# Patient Record
Sex: Female | Born: 1965
Health system: Southern US, Community
[De-identification: ages and names within clinical notes are randomized; demographics above are authoritative.]

## PROBLEM LIST (undated history)

## (undated) DIAGNOSIS — D649 Anemia, unspecified: Secondary | ICD-10-CM

## (undated) DIAGNOSIS — F329 Major depressive disorder, single episode, unspecified: Secondary | ICD-10-CM

## (undated) DIAGNOSIS — Z8489 Family history of other specified conditions: Secondary | ICD-10-CM

## (undated) DIAGNOSIS — M1990 Unspecified osteoarthritis, unspecified site: Secondary | ICD-10-CM

## (undated) DIAGNOSIS — K648 Other hemorrhoids: Secondary | ICD-10-CM

## (undated) DIAGNOSIS — F32A Depression, unspecified: Secondary | ICD-10-CM

## (undated) DIAGNOSIS — T7840XA Allergy, unspecified, initial encounter: Secondary | ICD-10-CM

## (undated) DIAGNOSIS — F419 Anxiety disorder, unspecified: Secondary | ICD-10-CM

## (undated) DIAGNOSIS — K649 Unspecified hemorrhoids: Secondary | ICD-10-CM

## (undated) DIAGNOSIS — G43909 Migraine, unspecified, not intractable, without status migrainosus: Secondary | ICD-10-CM

## (undated) DIAGNOSIS — K644 Residual hemorrhoidal skin tags: Secondary | ICD-10-CM

## (undated) DIAGNOSIS — M797 Fibromyalgia: Secondary | ICD-10-CM

## (undated) HISTORY — DX: Migraine, unspecified, not intractable, without status migrainosus: G43.909

## (undated) HISTORY — PX: OTHER SURGICAL HISTORY: SHX169

## (undated) HISTORY — PX: COLONOSCOPY: SHX174

## (undated) HISTORY — DX: Unspecified hemorrhoids: K64.9

## (undated) HISTORY — DX: Fibromyalgia: M79.7

## (undated) HISTORY — PX: TONSILLECTOMY: SUR1361

## (undated) HISTORY — DX: Depression, unspecified: F32.A

## (undated) HISTORY — DX: Allergy, unspecified, initial encounter: T78.40XA

## (undated) HISTORY — DX: Residual hemorrhoidal skin tags: K64.4

## (undated) HISTORY — DX: Major depressive disorder, single episode, unspecified: F32.9

## (undated) HISTORY — DX: Unspecified osteoarthritis, unspecified site: M19.90

## (undated) HISTORY — DX: Other hemorrhoids: K64.8

## (undated) HISTORY — DX: Anemia, unspecified: D64.9

## (undated) HISTORY — DX: Anxiety disorder, unspecified: F41.9

## (undated) HISTORY — PX: LAPAROSCOPIC ASSISTED VAGINAL HYSTERECTOMY: SHX5398

---

## 1999-11-16 ENCOUNTER — Other Ambulatory Visit: Admission: RE | Admit: 1999-11-16 | Discharge: 1999-11-16 | Payer: Self-pay | Admitting: Internal Medicine

## 2000-09-30 ENCOUNTER — Encounter: Payer: Self-pay | Admitting: Orthopedic Surgery

## 2000-09-30 ENCOUNTER — Ambulatory Visit (HOSPITAL_COMMUNITY): Admission: RE | Admit: 2000-09-30 | Discharge: 2000-09-30 | Payer: Self-pay | Admitting: Orthopedic Surgery

## 2001-04-26 ENCOUNTER — Other Ambulatory Visit: Admission: RE | Admit: 2001-04-26 | Discharge: 2001-04-26 | Payer: Self-pay | Admitting: Internal Medicine

## 2004-01-27 ENCOUNTER — Other Ambulatory Visit: Admission: RE | Admit: 2004-01-27 | Discharge: 2004-01-27 | Payer: Self-pay | Admitting: Obstetrics and Gynecology

## 2005-02-26 ENCOUNTER — Other Ambulatory Visit: Admission: RE | Admit: 2005-02-26 | Discharge: 2005-02-26 | Payer: Self-pay | Admitting: Obstetrics and Gynecology

## 2005-10-19 ENCOUNTER — Encounter: Admission: RE | Admit: 2005-10-19 | Discharge: 2005-10-19 | Payer: Self-pay | Admitting: Orthopaedic Surgery

## 2006-02-15 ENCOUNTER — Ambulatory Visit: Payer: Self-pay | Admitting: Internal Medicine

## 2006-05-11 ENCOUNTER — Ambulatory Visit: Payer: Self-pay | Admitting: Psychiatry

## 2006-05-11 ENCOUNTER — Other Ambulatory Visit (HOSPITAL_COMMUNITY): Admission: RE | Admit: 2006-05-11 | Discharge: 2006-08-09 | Payer: Self-pay | Admitting: Psychiatry

## 2006-08-03 ENCOUNTER — Ambulatory Visit: Payer: Self-pay | Admitting: Internal Medicine

## 2006-08-03 ENCOUNTER — Encounter: Admission: RE | Admit: 2006-08-03 | Discharge: 2006-08-03 | Payer: Self-pay | Admitting: Internal Medicine

## 2006-09-29 ENCOUNTER — Ambulatory Visit: Payer: Self-pay | Admitting: Internal Medicine

## 2006-11-03 ENCOUNTER — Encounter: Admission: RE | Admit: 2006-11-03 | Discharge: 2006-11-03 | Payer: Self-pay | Admitting: Obstetrics and Gynecology

## 2007-05-08 DIAGNOSIS — R519 Headache, unspecified: Secondary | ICD-10-CM | POA: Insufficient documentation

## 2007-05-08 DIAGNOSIS — J45909 Unspecified asthma, uncomplicated: Secondary | ICD-10-CM | POA: Insufficient documentation

## 2007-05-08 DIAGNOSIS — R51 Headache: Secondary | ICD-10-CM | POA: Insufficient documentation

## 2007-05-08 DIAGNOSIS — F329 Major depressive disorder, single episode, unspecified: Secondary | ICD-10-CM | POA: Insufficient documentation

## 2007-05-08 DIAGNOSIS — J309 Allergic rhinitis, unspecified: Secondary | ICD-10-CM | POA: Insufficient documentation

## 2007-11-14 ENCOUNTER — Encounter: Admission: RE | Admit: 2007-11-14 | Discharge: 2007-12-14 | Payer: Self-pay | Admitting: Internal Medicine

## 2007-12-29 ENCOUNTER — Encounter: Admission: RE | Admit: 2007-12-29 | Discharge: 2007-12-29 | Payer: Self-pay | Admitting: Obstetrics and Gynecology

## 2009-03-21 ENCOUNTER — Ambulatory Visit: Payer: Self-pay | Admitting: Gastroenterology

## 2009-03-21 DIAGNOSIS — K644 Residual hemorrhoidal skin tags: Secondary | ICD-10-CM | POA: Insufficient documentation

## 2009-03-21 DIAGNOSIS — K625 Hemorrhage of anus and rectum: Secondary | ICD-10-CM | POA: Insufficient documentation

## 2009-04-07 ENCOUNTER — Encounter: Payer: Self-pay | Admitting: Gastroenterology

## 2009-04-07 ENCOUNTER — Ambulatory Visit: Payer: Self-pay | Admitting: Gastroenterology

## 2009-04-16 ENCOUNTER — Encounter: Payer: Self-pay | Admitting: Gastroenterology

## 2010-12-23 ENCOUNTER — Telehealth: Payer: Self-pay | Admitting: Gastroenterology

## 2010-12-23 NOTE — Telephone Encounter (Signed)
i don't do hemorrhoidal removal, injection or banding.  I generally refer these patients to general surgery instead.

## 2010-12-23 NOTE — Telephone Encounter (Signed)
Pt with hx of hemorrhoids since childbirth; last OV 03/21/09 with Analpram ointment ordered with Sitz baths, increase fiber and COLON ordered. COLON on 04/07/2009 showing internal and external hemorrhoids and sessile polyp removal- hyperplastic only. Pt saw Dr Henderson Cloud who informed her she can have the hemorrhoids banded. Dr Christella Hartigan, can you do this and does she need an OV first? Thanks.

## 2010-12-23 NOTE — Telephone Encounter (Signed)
Informed pt Dr Christella Hartigan does not do banding and he referred her to general surgery. Pt requested Dr Arlyce Dice. Dr Christella Hartigan, is it ok for pt to see Dr Arlyce Dice about banding? Thanks.

## 2010-12-24 NOTE — Telephone Encounter (Signed)
Yes, that is a good idea. 

## 2010-12-24 NOTE — Telephone Encounter (Signed)
Dr. Arlyce Dice do you want to see her first?

## 2010-12-24 NOTE — Telephone Encounter (Signed)
Dr Arlyce Dice, could you see one of Dr Christella Hartigan' pts for hemorrhoid banding? Thanks.

## 2010-12-24 NOTE — Telephone Encounter (Signed)
yes

## 2010-12-25 ENCOUNTER — Encounter: Payer: Self-pay | Admitting: *Deleted

## 2010-12-25 NOTE — Telephone Encounter (Signed)
Yes, needs OV

## 2010-12-25 NOTE — Telephone Encounter (Signed)
Spoke with pt to inform her she needs an appointment with Dr Arlyce Dice to discuss her hemorrhoidal banding- 01/07/11 at 11:15. New pt info and letter mailed to pt.

## 2011-01-01 NOTE — Assessment & Plan Note (Signed)
[facility]                                 ON-CALL NOTE   NAME:Wade, Debbie TROYER                   MRN:          [id number]  DATE:08/20/2006                            DOB:          May 02, 1966    She calls from [phone] on August 20, 2006 complaining of pinkeye.  She  is a patient of Dr. Fabian Sharp.  Th clinic was closed by the time the nurse  was able to get in touch with her with regards that the patient go to  the Urgent Care if she could not wait until Monday to be seen.     Lelon Perla, DO  Electronically Signed    Shawnie Dapper  DD: 08/20/2006  DT: 08/20/2006  Job #: [id number]   cc:   Neta Mends. Fabian Sharp, MD

## 2011-01-07 ENCOUNTER — Ambulatory Visit: Payer: Self-pay | Admitting: Gastroenterology

## 2011-01-14 ENCOUNTER — Ambulatory Visit (INDEPENDENT_AMBULATORY_CARE_PROVIDER_SITE_OTHER): Payer: Managed Care, Other (non HMO) | Admitting: Gastroenterology

## 2011-01-14 ENCOUNTER — Encounter: Payer: Self-pay | Admitting: Gastroenterology

## 2011-01-14 DIAGNOSIS — K625 Hemorrhage of anus and rectum: Secondary | ICD-10-CM

## 2011-01-14 DIAGNOSIS — K644 Residual hemorrhoidal skin tags: Secondary | ICD-10-CM

## 2011-01-14 NOTE — Progress Notes (Signed)
History of Present Illness:  Debbie Wade is a pleasant 45 year old white female referred at the request of Dr. Christella Hartigan for consideration of band ligation for hemorrhoids. Hemorrhids have been  a persistent, intermittent problem for years. She develops  red blood per rectum consisting of blood on the toilet tissue. It is occasionally accompanied by rectal discomfort. She's tried various suppositories and topicals without significant relief.  2010 colonoscopy revealed internal and external hemorrhoids and a diminutive polyp which was hyperplastic.    Review of Systems: Pertinent positive and negative review of systems were noted in the above HPI section. All other review of systems were otherwise negative.    Current Medications, Allergies, Past Medical History, Past Surgical History, Family History and Social History were reviewed in [employer] electronic medical record  Vital signs were reviewed in today's medical record. Physical Exam: General: Well developed , well nourished, no acute distress Head: Normocephalic and atraumatic Eyes:  sclerae anicteric, EOMI Ears: Normal auditory acuity Mouth: No deformity or lesions Lungs: Clear throughout to auscultation Heart: Regular rate and rhythm; no murmurs, rubs or bruits Abdomen: Soft, non tender and non distended. No masses, hepatosplenomegaly or hernias noted. Normal Bowel sounds Rectal: There is an external skin tag and a single external hemorrhoid. Musculoskeletal: Symmetrical with no gross deformities  Pulses:  Normal pulses noted Extremities: No clubbing, cyanosis, edema or deformities noted Neurological: Alert oriented x 4, grossly nonfocal Psychological:  Alert and cooperative. Normal mood and affect

## 2011-01-14 NOTE — Assessment & Plan Note (Signed)
Limited rectal bleeding is due to hemorrhoids.  Recommendations #1 band ligation for internal hemorrhoids. I carefully explained to the patient this will not directly treat her external hemorrhoid nor her skin tag. Should she continue to have symptoms from these then surgical ligation will be required.

## 2011-01-14 NOTE — Patient Instructions (Signed)
Hemorrhoids Hemorrhoids are dilated (enlarged) veins around the rectum. Sometimes clots will form in the veins. This makes them swollen and painful. These are called thrombosed hemorrhoids. Causes of hemorrhoids include:  Pregnancy: this increases the pressure in the hemorrhoidal veins.   Constipation.   Straining to have a bowel movement.  HOME CARE INSTRUCTIONS  Eat a well balanced diet and drink 6 to 8 glasses of water every day to avoid constipation. You may also use a bulk laxative.   Avoid straining to have bowel movements.   Keep anal area dry and clean.   Only take over-the-counter or prescription medicines for pain, discomfort, or fever as directed by your caregiver.  If thrombosed:  Take hot sitz baths for 20 to 30 minutes, 3 to 4 times per day.   If the hemorrhoids are very tender and swollen, place ice packs on area as tolerated. Using ice packs between sitz baths may be helpful. Fill a plastic bag with ice and use a towel between the bag of ice and your skin.   Special creams and suppositories (Anusol, Nupercainal, Wyanoids) may be used or applied as directed.   Do not use a donut shaped pillow or sit on the toilet for long periods. This increases blood pooling and pain.   Move your bowels when your body has the urge; this will require less straining and will decrease pain and pressure.   Only take over-the-counter or prescription medicines for pain, discomfort, or fever as directed by your caregiver.  SEEK MEDICAL CARE IF:  You have increasing pain and swelling that is not controlled with your prescription.   You have uncontrolled bleeding.   You have an inability or difficulty having a bowel movement.   You have pain or inflammation outside the area of the hemorrhoids.  MAKE SURE YOU:   Understand these instructions.   Will watch your condition.   Will get help right away if you are not doing well or get worse.  Document Released: 07/30/2000 Document  Re-Released: 07/15/2008 [facility] Patient Information 2011 [place], [place].

## 2011-01-14 NOTE — Assessment & Plan Note (Signed)
See comments under rectal bleeding.

## 2011-01-27 ENCOUNTER — Ambulatory Visit (HOSPITAL_COMMUNITY)
Admission: RE | Admit: 2011-01-27 | Discharge: 2011-01-27 | Disposition: A | Discharge status: Home / Self Care | Payer: Managed Care, Other (non HMO) | Source: Ambulatory Visit | Attending: Gastroenterology | Admitting: Gastroenterology

## 2011-01-27 ENCOUNTER — Encounter: Payer: Managed Care, Other (non HMO) | Admitting: Gastroenterology

## 2011-01-27 DIAGNOSIS — F341 Dysthymic disorder: Secondary | ICD-10-CM | POA: Insufficient documentation

## 2011-01-27 DIAGNOSIS — K648 Other hemorrhoids: Secondary | ICD-10-CM | POA: Insufficient documentation

## 2011-02-02 ENCOUNTER — Encounter: Payer: Self-pay | Admitting: Gastroenterology

## 2011-02-26 ENCOUNTER — Ambulatory Visit (INDEPENDENT_AMBULATORY_CARE_PROVIDER_SITE_OTHER): Payer: Managed Care, Other (non HMO) | Admitting: Gastroenterology

## 2011-02-26 ENCOUNTER — Encounter: Payer: Self-pay | Admitting: Gastroenterology

## 2011-02-26 VITALS — BP 92/68 | HR 80 | Ht 63.0 in | Wt 116.2 lb

## 2011-02-26 DIAGNOSIS — K625 Hemorrhage of anus and rectum: Secondary | ICD-10-CM

## 2011-02-26 NOTE — Progress Notes (Signed)
History of Present Illness:  [name] returns following band ligation for hemorrhoids. She reports significant improvement in her symptoms. She is asymptomatic and is feeling quite well.    Review of Systems: Pertinent positive and negative review of systems were noted in the above HPI section. All other review of systems were otherwise negative.    Current Medications, Allergies, Past Medical History, Past Surgical History, Family History and Social History were reviewed in [employer] electronic medical record  Vital signs were reviewed in today's medical record. Physical Exam: General: Well developed , well nourished, no acute distress

## 2011-02-26 NOTE — Patient Instructions (Signed)
Follow up as needed

## 2011-02-26 NOTE — Assessment & Plan Note (Signed)
Secondary to internal hemorrhoids, status post band ligation with resolution of symptoms.

## 2012-02-07 ENCOUNTER — Other Ambulatory Visit: Payer: Self-pay | Admitting: Rheumatology

## 2012-02-07 DIAGNOSIS — M545 Low back pain, unspecified: Secondary | ICD-10-CM

## 2012-02-10 ENCOUNTER — Ambulatory Visit
Admission: RE | Admit: 2012-02-10 | Discharge: 2012-02-10 | Disposition: A | Discharge status: Home / Self Care | Payer: Managed Care, Other (non HMO) | Source: Ambulatory Visit | Attending: Rheumatology | Admitting: Rheumatology

## 2012-02-10 DIAGNOSIS — M545 Low back pain, unspecified: Secondary | ICD-10-CM

## 2012-02-10 MED ORDER — GADOBENATE DIMEGLUMINE 529 MG/ML IV SOLN
10.0000 mL | Freq: Once | INTRAVENOUS | Status: AC | PRN
  Administered 2012-02-10: 10 mL via INTRAVENOUS

## 2012-02-15 ENCOUNTER — Emergency Department (HOSPITAL_COMMUNITY)
Admission: EM | Admit: 2012-02-15 | Discharge: 2012-02-15 | Disposition: A | Discharge status: Home / Self Care | Payer: Managed Care, Other (non HMO) | Attending: Emergency Medicine | Admitting: Emergency Medicine

## 2012-02-15 ENCOUNTER — Encounter (HOSPITAL_COMMUNITY): Payer: Self-pay | Admitting: Emergency Medicine

## 2012-02-15 DIAGNOSIS — S61219A Laceration without foreign body of unspecified finger without damage to nail, initial encounter: Secondary | ICD-10-CM

## 2012-02-15 DIAGNOSIS — M1990 Unspecified osteoarthritis, unspecified site: Secondary | ICD-10-CM | POA: Insufficient documentation

## 2012-02-15 DIAGNOSIS — S61209A Unspecified open wound of unspecified finger without damage to nail, initial encounter: Secondary | ICD-10-CM | POA: Insufficient documentation

## 2012-02-15 DIAGNOSIS — W260XXA Contact with knife, initial encounter: Secondary | ICD-10-CM | POA: Insufficient documentation

## 2012-02-15 DIAGNOSIS — Y998 Other external cause status: Secondary | ICD-10-CM | POA: Insufficient documentation

## 2012-02-15 DIAGNOSIS — IMO0001 Reserved for inherently not codable concepts without codable children: Secondary | ICD-10-CM | POA: Insufficient documentation

## 2012-02-15 DIAGNOSIS — Y93G1 Activity, food preparation and clean up: Secondary | ICD-10-CM | POA: Insufficient documentation

## 2012-02-15 NOTE — ED Provider Notes (Signed)
Medical screening examination/treatment/procedure(s) were performed by non-physician practitioner and as supervising physician I was immediately available for consultation/collaboration.  Bridey Brookover, MD 02/15/12 1528 

## 2012-02-15 NOTE — ED Provider Notes (Signed)
History     CSN: [id number]  Arrival date & time 02/15/12  1414   First MD Initiated Contact with Patient 02/15/12 1501      No chief complaint on file.   (Consider location/radiation/quality/duration/timing/severity/associated sxs/prior treatment) HPI His resists emergency department a small laceration to her left index finger at the base.  This issues cumin avocado when the knife slipped stabbing her finger.  Patient states he did not have any numbness or weakness of the finger.  Patient states that she put direct pressure on my area before coming to the hospital.  Bleeding is controlled upon arrival. Past Medical History  Diagnosis Date  . Migraines   . Allergic rhinitis   . Depression   . Asthma   . Hemorrhoid   . Fibromyalgia   . Osteoarthritis   . Hemorrhoids     Past Surgical History  Procedure Date  . None     Family History  Problem Relation Age of Onset  . Diabetes Maternal Grandmother   . Hypertension Mother   . Diabetes Maternal Grandfather   . Heart disease Maternal Grandfather   . Heart failure Maternal Grandmother   . Colon cancer Neg Hx   . Breast cancer Paternal Aunt     History  Substance Use Topics  . Smoking status: Never Smoker   . Smokeless tobacco: Never Used  . Alcohol Use: Yes     rare    OB History    Grav Para Term Preterm Abortions TAB SAB Ect Mult Living                  Review of Systems All other systems negative except as documented in the HPI. All pertinent positives and negatives as reviewed in the HPI.  Allergies  Erythromycin  Home Medications   Current Outpatient Rx  Name Route Sig Dispense Refill  . ALMOTRIPTAN MALATE 12.5 MG PO TABS Oral Take 12.5 mg by mouth as needed. may repeat in 2 hours if needed    . ALPRAZOLAM 0.25 MG PO TABS Oral Take 0.25 mg by mouth as needed. Anxiety    . DOCUSATE SODIUM 50 MG PO CAPS Oral Take by mouth at bedtime.    Marland Kitchen HYDROCODONE-ACETAMINOPHEN 5-500 MG PO CAPS Oral Take 1 capsule  by mouth every 6 (six) hours as needed. pain    . IBUPROFEN 200 MG PO TABS Oral Take 600 mg by mouth every 6 (six) hours as needed. pain    . LEVETIRACETAM 500 MG PO TABS Oral Take 1,000-1,500 mg by mouth 2 (two) times daily.     . ADULT MULTIVITAMIN W/MINERALS CH Oral Take 1 tablet by mouth daily.    . SERTRALINE HCL 100 MG PO TABS Oral Take 100 mg by mouth daily.    . TRAMADOL HCL 50 MG PO TABS Oral Take 50 mg by mouth every 6 (six) hours as needed. pain      BP 114/69  Pulse 89  Temp 98 F (36.7 C)  Resp 16  SpO2 100%  LMP 01/28/2012  Physical Exam  Nursing note and vitals reviewed. Constitutional: She appears well-developed and well-nourished. No distress.  Musculoskeletal:       Left hand: She exhibits laceration. She exhibits normal range of motion, normal two-point discrimination and normal capillary refill. normal sensation noted. Normal strength noted. She exhibits no finger abduction and no thumb/finger opposition.       Hands:   ED Course  Procedures (including critical care time)  LACERATION REPAIR  Performed by: Carlyle Dolly Authorized by: Carlyle Dolly Consent: Verbal consent obtained. Risks and benefits: risks, benefits and alternatives were discussed Consent given by: patient Patient identity confirmed: provided demographic data Prepped and Draped in normal sterile fashion Wound explored  Laceration Location: medial proximal palmar aspect of the R index finger.  Laceration Length: 1.2 cm  No Foreign Bodies seen or palpated  Anesthesia: local infiltration  Local anesthetic: lidocaine2%   Anesthetic total: 4 ml  Irrigation method: syringe Amount of cleaning: standard  Skin closure: 5-0 prolene   Number of sutures: 3  Technique: Simple interrupted  Patient tolerance: Patient tolerated the procedure well with no immediate complications.    MDM          Carlyle Dolly, PA-C 02/15/12 1506

## 2012-02-15 NOTE — ED Notes (Signed)
Pt cut left index finger while cooking today. Small lac with controlled blooding noted. Pulses and sensation present to hand.

## 2012-05-23 ENCOUNTER — Other Ambulatory Visit: Payer: Self-pay | Admitting: Obstetrics and Gynecology

## 2012-11-09 ENCOUNTER — Other Ambulatory Visit (HOSPITAL_COMMUNITY): Payer: Self-pay | Admitting: *Deleted

## 2012-11-10 ENCOUNTER — Encounter (HOSPITAL_COMMUNITY): Payer: Managed Care, Other (non HMO)

## 2012-11-23 ENCOUNTER — Ambulatory Visit (HOSPITAL_COMMUNITY)
Admission: RE | Admit: 2012-11-23 | Discharge: 2012-11-23 | Disposition: A | Discharge status: Home / Self Care | Payer: Managed Care, Other (non HMO) | Source: Ambulatory Visit | Attending: Endocrinology | Admitting: Endocrinology

## 2012-11-23 DIAGNOSIS — E2749 Other adrenocortical insufficiency: Secondary | ICD-10-CM | POA: Insufficient documentation

## 2012-11-23 MED ORDER — COSYNTROPIN 0.25 MG IJ SOLR
0.2500 mg | Freq: Once | INTRAMUSCULAR | Status: AC
  Administered 2012-11-23: 0.25 mg via INTRAVENOUS
  Filled 2012-11-23: qty 0.25

## 2012-11-23 MED ORDER — SODIUM CHLORIDE 0.9 % IV SOLN
Freq: Once | INTRAVENOUS | Status: AC
  Administered 2012-11-23: 09:00:00 via INTRAVENOUS

## 2012-11-24 LAB — ACTH STIMULATION, 3 TIME POINTS
Cortisol, 30 Min: 17.2 ug/dL — ABNORMAL LOW (ref >20)
Cortisol, Base: 9.4 ug/dL

## 2012-11-24 LAB — ACTH: C206 ACTH: 18 pg/mL (ref 10–46)

## 2014-11-05 ENCOUNTER — Other Ambulatory Visit: Payer: Self-pay | Admitting: Obstetrics and Gynecology

## 2014-11-07 ENCOUNTER — Other Ambulatory Visit: Payer: Self-pay | Admitting: Obstetrics and Gynecology

## 2014-11-07 DIAGNOSIS — R928 Other abnormal and inconclusive findings on diagnostic imaging of breast: Secondary | ICD-10-CM

## 2014-11-07 LAB — CYTOLOGY - PAP

## 2014-11-14 ENCOUNTER — Ambulatory Visit
Admission: RE | Admit: 2014-11-14 | Discharge: 2014-11-14 | Disposition: A | Discharge status: Home / Self Care | Payer: Managed Care, Other (non HMO) | Source: Ambulatory Visit | Attending: Obstetrics and Gynecology | Admitting: Obstetrics and Gynecology

## 2014-11-14 DIAGNOSIS — R928 Other abnormal and inconclusive findings on diagnostic imaging of breast: Secondary | ICD-10-CM

## 2015-03-27 ENCOUNTER — Encounter: Payer: Self-pay | Admitting: Gastroenterology

## 2015-12-23 ENCOUNTER — Other Ambulatory Visit: Payer: Self-pay | Admitting: Orthopaedic Surgery

## 2015-12-23 DIAGNOSIS — M47819 Spondylosis without myelopathy or radiculopathy, site unspecified: Secondary | ICD-10-CM

## 2015-12-30 ENCOUNTER — Ambulatory Visit
Admission: RE | Admit: 2015-12-30 | Discharge: 2015-12-30 | Disposition: A | Discharge status: Home / Self Care | Payer: Managed Care, Other (non HMO) | Source: Ambulatory Visit | Attending: Orthopaedic Surgery | Admitting: Orthopaedic Surgery

## 2015-12-30 DIAGNOSIS — M47819 Spondylosis without myelopathy or radiculopathy, site unspecified: Secondary | ICD-10-CM

## 2019-01-09 DIAGNOSIS — F419 Anxiety disorder, unspecified: Secondary | ICD-10-CM | POA: Diagnosis not present

## 2019-01-09 DIAGNOSIS — F3340 Major depressive disorder, recurrent, in remission, unspecified: Secondary | ICD-10-CM | POA: Diagnosis not present

## 2019-02-06 DIAGNOSIS — F3340 Major depressive disorder, recurrent, in remission, unspecified: Secondary | ICD-10-CM | POA: Diagnosis not present

## 2019-02-06 DIAGNOSIS — F419 Anxiety disorder, unspecified: Secondary | ICD-10-CM | POA: Diagnosis not present

## 2019-02-11 DIAGNOSIS — L259 Unspecified contact dermatitis, unspecified cause: Secondary | ICD-10-CM | POA: Diagnosis not present

## 2019-02-13 DIAGNOSIS — H0102A Squamous blepharitis right eye, upper and lower eyelids: Secondary | ICD-10-CM | POA: Diagnosis not present

## 2019-02-13 DIAGNOSIS — H04123 Dry eye syndrome of bilateral lacrimal glands: Secondary | ICD-10-CM | POA: Diagnosis not present

## 2019-02-13 DIAGNOSIS — H25013 Cortical age-related cataract, bilateral: Secondary | ICD-10-CM | POA: Diagnosis not present

## 2019-02-13 DIAGNOSIS — H43391 Other vitreous opacities, right eye: Secondary | ICD-10-CM | POA: Diagnosis not present

## 2019-03-06 DIAGNOSIS — F419 Anxiety disorder, unspecified: Secondary | ICD-10-CM | POA: Diagnosis not present

## 2019-03-06 DIAGNOSIS — F3340 Major depressive disorder, recurrent, in remission, unspecified: Secondary | ICD-10-CM | POA: Diagnosis not present

## 2019-03-16 ENCOUNTER — Encounter: Payer: Self-pay | Admitting: Gastroenterology

## 2019-04-03 DIAGNOSIS — F3340 Major depressive disorder, recurrent, in remission, unspecified: Secondary | ICD-10-CM | POA: Diagnosis not present

## 2019-04-03 DIAGNOSIS — F419 Anxiety disorder, unspecified: Secondary | ICD-10-CM | POA: Diagnosis not present

## 2019-04-18 DIAGNOSIS — F4322 Adjustment disorder with anxiety: Secondary | ICD-10-CM | POA: Diagnosis not present

## 2019-04-24 DIAGNOSIS — F4322 Adjustment disorder with anxiety: Secondary | ICD-10-CM | POA: Diagnosis not present

## 2019-04-25 DIAGNOSIS — F4322 Adjustment disorder with anxiety: Secondary | ICD-10-CM | POA: Diagnosis not present

## 2019-04-30 ENCOUNTER — Encounter: Payer: Self-pay | Admitting: Gastroenterology

## 2019-05-01 DIAGNOSIS — F4322 Adjustment disorder with anxiety: Secondary | ICD-10-CM | POA: Diagnosis not present

## 2019-05-01 DIAGNOSIS — F3340 Major depressive disorder, recurrent, in remission, unspecified: Secondary | ICD-10-CM | POA: Diagnosis not present

## 2019-05-01 DIAGNOSIS — F419 Anxiety disorder, unspecified: Secondary | ICD-10-CM | POA: Diagnosis not present

## 2019-05-04 DIAGNOSIS — F4322 Adjustment disorder with anxiety: Secondary | ICD-10-CM | POA: Diagnosis not present

## 2019-05-15 DIAGNOSIS — F3340 Major depressive disorder, recurrent, in remission, unspecified: Secondary | ICD-10-CM | POA: Diagnosis not present

## 2019-05-15 DIAGNOSIS — F419 Anxiety disorder, unspecified: Secondary | ICD-10-CM | POA: Diagnosis not present

## 2019-05-24 DIAGNOSIS — F419 Anxiety disorder, unspecified: Secondary | ICD-10-CM | POA: Diagnosis not present

## 2019-05-29 DIAGNOSIS — G43719 Chronic migraine without aura, intractable, without status migrainosus: Secondary | ICD-10-CM | POA: Diagnosis not present

## 2019-05-29 DIAGNOSIS — G43109 Migraine with aura, not intractable, without status migrainosus: Secondary | ICD-10-CM | POA: Diagnosis not present

## 2019-06-01 ENCOUNTER — Other Ambulatory Visit: Payer: Self-pay

## 2019-06-01 ENCOUNTER — Ambulatory Visit (AMBULATORY_SURGERY_CENTER): Payer: BC Managed Care – PPO | Admitting: *Deleted

## 2019-06-01 VITALS — Temp 96.2°F | Ht 63.0 in | Wt 124.2 lb

## 2019-06-01 DIAGNOSIS — Z1159 Encounter for screening for other viral diseases: Secondary | ICD-10-CM

## 2019-06-01 DIAGNOSIS — Z1211 Encounter for screening for malignant neoplasm of colon: Secondary | ICD-10-CM

## 2019-06-01 MED ORDER — PEG 3350-KCL-NA BICARB-NACL 420 G PO SOLR: 4000.0000 mL | Freq: Once | ORAL | 0 refills | Status: AC

## 2019-06-01 NOTE — Progress Notes (Signed)
No egg or soy allergy known to patient  No issues with past sedation with any surgeries  or procedures, no intubation problems  No diet pills per patient No home 02 use per patient  No blood thinners per patient  Pt denies issues with constipation  No A fib or A flutter  EMMI video sent to pt's e mail   Due to the COVID-19 pandemic we are asking patients to follow these guidelines. Please only bring one care partner. Please be aware that your care partner may wait in the car in the parking lot or if they feel like they will be too hot to wait in the car, they may wait in the lobby on the 4th floor. All care partners are required to wear a mask the entire time (we do not have any that we can provide them), they need to practice social distancing, and we will do a Covid check for all patient's and care partners when you arrive. Also we will check their temperature and your temperature. If the care partner waits in their car they need to stay in the parking lot the entire time and we will call them on their cell phone when the patient is ready for discharge so they can bring the car to the front of the building. Also all patient's will need to wear a mask into building.  covid screening 06/12/19, 8:15 am

## 2019-06-05 ENCOUNTER — Encounter: Payer: Self-pay | Admitting: Gastroenterology

## 2019-06-08 ENCOUNTER — Telehealth: Payer: Self-pay | Admitting: Gastroenterology

## 2019-06-08 NOTE — Telephone Encounter (Signed)
Pt states has a huge skin tag that cannot be removed  and having a nasty flare of hemorrhoids-  She wanted to make Dr Ardis Hughs aware- I encouraged her to tell DR Ardis Hughs this as well the day of her colon- also fo rher to use Prep H wipes or desitin or vasline while she preps  Debbie Wade PV

## 2019-06-08 NOTE — Telephone Encounter (Signed)
Pls call pt, she states that she forgot to mention some information during her pre-visit.

## 2019-06-08 NOTE — Telephone Encounter (Signed)
Golytely on back order- Suprep 60$ with coupon-  Pt states too expensive- Sample Suprep with new instructions Lot [id number] exp 7/22- pt to pick up in [facility] room 51 Tuesday 10/27 before her covid test - I told pt 50 but nancy is aware if this note so she put in room 51 as pt will pick up before 8 am - she and I went over how to do [place] over the phone and foods to hold prior to colon-

## 2019-06-08 NOTE — Telephone Encounter (Signed)
Pt is scheduled for a 06/15/19 colon and reported that [facility] stated that all prep solutions are on backorder.

## 2019-06-12 ENCOUNTER — Other Ambulatory Visit: Payer: Self-pay

## 2019-06-12 DIAGNOSIS — F419 Anxiety disorder, unspecified: Secondary | ICD-10-CM | POA: Diagnosis not present

## 2019-06-12 DIAGNOSIS — Z1159 Encounter for screening for other viral diseases: Secondary | ICD-10-CM

## 2019-06-12 NOTE — Progress Notes (Signed)
c 

## 2019-06-13 LAB — SARS CORONAVIRUS 2 (TAT 6-24 HRS): SARS Coronavirus 2: NEGATIVE

## 2019-06-15 ENCOUNTER — Encounter: Payer: Self-pay | Admitting: Gastroenterology

## 2019-06-15 ENCOUNTER — Other Ambulatory Visit: Payer: Self-pay

## 2019-06-15 ENCOUNTER — Ambulatory Visit (AMBULATORY_SURGERY_CENTER): Payer: BC Managed Care – PPO | Admitting: Gastroenterology

## 2019-06-15 VITALS — BP 110/70 | HR 65 | Temp 97.8°F | Resp 13 | Ht 63.0 in | Wt 124.0 lb

## 2019-06-15 DIAGNOSIS — Z1211 Encounter for screening for malignant neoplasm of colon: Secondary | ICD-10-CM | POA: Diagnosis not present

## 2019-06-15 DIAGNOSIS — K649 Unspecified hemorrhoids: Secondary | ICD-10-CM

## 2019-06-15 MED ORDER — SODIUM CHLORIDE 0.9 % IV SOLN: 500.0000 mL | Freq: Once | INTRAVENOUS | Status: DC

## 2019-06-15 NOTE — Progress Notes (Signed)
Patient considering hemorrhoid banding procedure in the office. I called Patty in the GI office to alert her to call patient to schedule this once patient gets home and checks her schedule.

## 2019-06-15 NOTE — Progress Notes (Signed)
Report to PACU, RN, vss, BBS= Clear.  

## 2019-06-15 NOTE — Op Note (Signed)
[facility] Patient Name: Debbie Wade Procedure Date: 06/15/2019 8:34 AM MRN: [id number] Endoscopist: Rachael Fee , MD Age: 53 Referring MD:  Date of Birth: 09-26-1965 Gender: Female Account #: [id number] Procedure:                Colonoscopy Indications:              Screening for colorectal malignant neoplasm Medicines:                Monitored Anesthesia Care Procedure:                Pre-Anesthesia Assessment:                           - Prior to the procedure, a History and Physical                            was performed, and patient medications and                            allergies were reviewed. The patient's tolerance of                            previous anesthesia was also reviewed. The risks                            and benefits of the procedure and the sedation                            options and risks were discussed with the patient.                            All questions were answered, and informed consent                            was obtained. Prior Anticoagulants: The patient has                            taken no previous anticoagulant or antiplatelet                            agents. ASA Grade Assessment: II - A patient with                            mild systemic disease. After reviewing the risks                            and benefits, the patient was deemed in                            satisfactory condition to undergo the procedure.                           After obtaining informed consent, the colonoscope  was passed under direct vision. Throughout the                            procedure, the patient's blood pressure, pulse, and                            oxygen saturations were monitored continuously. The                            Colonoscope was introduced through the anus and                            advanced to the the cecum, identified by                            appendiceal orifice  and ileocecal valve. The                            colonoscopy was performed without difficulty. The                            patient tolerated the procedure well. The quality                            of the bowel preparation was good. The ileocecal                            valve, appendiceal orifice, and rectum were                            photographed. Scope In: 8:51:59 AM Scope Out: 9:08:41 AM Scope Withdrawal Time: 0 hours 7 minutes 1 second  Total Procedure Duration: 0 hours 16 minutes 42 seconds  Findings:                 External and internal hemorrhoids were found. The                            hemorrhoids were medium-sized.                           The exam was otherwise without abnormality on                            direct and retroflexion views. Complications:            No immediate complications. Estimated blood loss:                            None. Estimated Blood Loss:     Estimated blood loss: none. Impression:               - External and internal hemorrhoids.                           - The examination was otherwise normal on direct  and retroflexion views.                           - No polyps or cancers. Recommendation:           - Patient has a contact number available for                            emergencies. The signs and symptoms of potential                            delayed complications were discussed with the                            patient. Return to normal activities tomorrow.                            Written discharge instructions were provided to the                            patient.                           - Resume previous diet.                           - Continue present medications.                           - Repeat colonoscopy in 10 years for screening.                           - Consider referral for in-office hemorrhoidal                            banding. Rachael Feeaniel P Denim Start, MD 06/15/2019  9:11:37 AM This report has been signed electronically.

## 2019-06-15 NOTE — Patient Instructions (Addendum)
YOU HAD AN ENDOSCOPIC PROCEDURE TODAY AT [facility]:   Refer to the procedure report that was given to you for any specific questions about what was found during the examination.  If the procedure report does not answer your questions, please call your gastroenterologist to clarify.  If you requested that your care partner not be given the details of your procedure findings, then the procedure report has been included in a sealed envelope for you to review at your convenience later.  YOU SHOULD EXPECT: Some feelings of bloating in the abdomen. Passage of more gas than usual.  Walking can help get rid of the air that was put into your GI tract during the procedure and reduce the bloating. If you had a lower endoscopy (such as a colonoscopy or flexible sigmoidoscopy) you may notice spotting of blood in your stool or on the toilet paper. If you underwent a bowel prep for your procedure, you may not have a normal bowel movement for a few days.  Please Note:  You might notice some irritation and congestion in your nose or some drainage.  This is from the oxygen used during your procedure.  There is no need for concern and it should clear up in a day or so.  SYMPTOMS TO REPORT IMMEDIATELY:   Following lower endoscopy (colonoscopy or flexible sigmoidoscopy):  Excessive amounts of blood in the stool  Significant tenderness or worsening of abdominal pains  Swelling of the abdomen that is new, acute  Fever of 100F or higher   For urgent or emergent issues, a gastroenterologist can be reached at any hour by calling [phone].   DIET:  We do recommend a small meal at first, but then you may proceed to your regular diet.  Drink plenty of fluids but you should avoid alcoholic beverages for 24 hours.  MEDICATIONS: Continue present medications.  Follow up: Consider returning to the office for hemorrhoid banding (see handout).  Please see handouts given to you by your recovery  nurse.  ACTIVITY:  You should plan to take it easy for the rest of today and you should NOT DRIVE or use heavy machinery until tomorrow (because of the sedation medicines used during the test).    FOLLOW UP: Our staff will call the number listed on your records 48-72 hours following your procedure to check on you and address any questions or concerns that you may have regarding the information given to you following your procedure. If we do not reach you, we will leave a message.  We will attempt to reach you two times.  During this call, we will ask if you have developed any symptoms of COVID 19. If you develop any symptoms (ie: fever, flu-like symptoms, shortness of breath, cough etc.) before then, please call [phone].  If you test positive for Covid 19 in the 2 weeks post procedure, please call and report this information to [place].    If any biopsies were taken you will be contacted by phone or by letter within the next 1-3 weeks.  Please call us at [phone] if you have not heard about the biopsies in 3 weeks.   Thank you for allowing [place] to provide for your healthcare needs today.   SIGNATURES/CONFIDENTIALITY: You and/or your care partner have signed paperwork which will be entered into your electronic medical record.  These signatures attest to the fact that that the information above on your After Visit Summary has been reviewed and is understood.  Full responsibility of the confidentiality of this discharge information lies with you and/or your care-partner.

## 2019-06-15 NOTE — Progress Notes (Signed)
Pt's states no medical or surgical changes since previsit or office visit. VS by CW. Temp by LC 

## 2019-06-19 ENCOUNTER — Telehealth: Payer: Self-pay

## 2019-06-19 ENCOUNTER — Telehealth: Payer: Self-pay | Admitting: *Deleted

## 2019-06-19 NOTE — Telephone Encounter (Signed)
  Follow up Call-  Call back number 06/15/2019  Post procedure Call Back phone  # [phone]  Permission to leave phone message Yes  Some recent data might be hidden     Patient questions:  Do you have a fever, pain , or abdominal swelling? No. Pain Score  0 *  Have you tolerated food without any problems? Yes.    Have you been able to return to your normal activities? Yes.    Do you have any questions about your discharge instructions: Diet   No. Medications  No. Follow up visit  No.  Do you have questions or concerns about your Care? No.  Actions: * If pain score is 4 or above: No action needed, pain <4.  1. Have you developed a fever since your procedure? no  2.   Have you had an respiratory symptoms (SOB or cough) since your procedure? no  3.   Have you tested positive for COVID 19 since your procedureno  4.   Have you had any family members/close contacts diagnosed with the COVID 19 since your procedure?  no   If yes to any of these questions please route to Joylene John, RN and Alphonsa Gin, Therapist, sports.

## 2019-06-19 NOTE — Telephone Encounter (Signed)
Left a detailed message for her to call us back if she wants to proceed with the hemorrhoid banding.

## 2019-06-19 NOTE — Telephone Encounter (Signed)
Attempted to reach pt. With follow-up call following endoscopic procedure 06/15/2019.  LM on pt. Voice mail.  Will try to reach pt. Again later today. 

## 2019-06-19 NOTE — Telephone Encounter (Signed)
-----   Message from Tanay Massiah E [place], [place] sent at 06/15/2019 10:31 AM EDT -----  Dr Ardis Hughs patient needs to be set up for a banding with someone. Call and set it up. She had a [facility] procedure on Friday.

## 2019-06-20 NOTE — Telephone Encounter (Signed)
Pt called back and has an appointment on 06/22/2019 with Dr Silverio Decamp.

## 2019-06-22 ENCOUNTER — Encounter: Payer: BC Managed Care – PPO | Admitting: Gastroenterology

## 2019-06-29 DIAGNOSIS — F4322 Adjustment disorder with anxiety: Secondary | ICD-10-CM | POA: Diagnosis not present

## 2019-07-04 DIAGNOSIS — F419 Anxiety disorder, unspecified: Secondary | ICD-10-CM | POA: Diagnosis not present

## 2019-07-06 DIAGNOSIS — F4322 Adjustment disorder with anxiety: Secondary | ICD-10-CM | POA: Diagnosis not present

## 2019-07-18 ENCOUNTER — Ambulatory Visit: Payer: BC Managed Care – PPO | Admitting: Gastroenterology

## 2019-07-18 ENCOUNTER — Encounter: Payer: Self-pay | Admitting: Gastroenterology

## 2019-07-18 VITALS — BP 92/70 | HR 72 | Temp 97.8°F | Ht 63.0 in | Wt 125.4 lb

## 2019-07-18 DIAGNOSIS — K641 Second degree hemorrhoids: Secondary | ICD-10-CM | POA: Diagnosis not present

## 2019-07-18 DIAGNOSIS — F4322 Adjustment disorder with anxiety: Secondary | ICD-10-CM | POA: Diagnosis not present

## 2019-07-18 NOTE — Patient Instructions (Signed)

## 2019-07-18 NOTE — Progress Notes (Signed)
PROCEDURE NOTE: The patient presents with symptomatic grade II hemorrhoids, requesting rubber band ligation of his/her hemorrhoidal disease.  All risks, benefits and alternative forms of therapy were described and informed consent was obtained.  In the Left Lateral Decubitus position anoscopic examination revealed grade II hemorrhoids in the Right anterior, right posterior and left lateral position(s).  The anorectum was pre-medicated with 0.125% nitroglycerin and RectiCare The decision was made to band the right anterior internal hemorrhoid, and the [place] was used to perform band ligation without complication.  Digital anorectal examination was then performed to assure proper positioning of the band, and to adjust the banded tissue as required.  The patient was discharged home without pain or other issues.  Dietary and behavioral recommendations were given and along with follow-up instructions.     The following adjunctive treatments were recommended: Benefiber 1 tablespoon 3 times daily with meals  The patient will return in 2 to 4 weeks for  follow-up and possible additional banding as required. No complications were encountered and the patient tolerated the procedure well.  Damaris Hippo , MD [phone]

## 2019-07-20 DIAGNOSIS — F4322 Adjustment disorder with anxiety: Secondary | ICD-10-CM | POA: Diagnosis not present

## 2019-07-24 DIAGNOSIS — F4322 Adjustment disorder with anxiety: Secondary | ICD-10-CM | POA: Diagnosis not present

## 2019-07-25 DIAGNOSIS — F4322 Adjustment disorder with anxiety: Secondary | ICD-10-CM | POA: Diagnosis not present

## 2019-07-31 DIAGNOSIS — F4322 Adjustment disorder with anxiety: Secondary | ICD-10-CM | POA: Diagnosis not present

## 2019-08-02 DIAGNOSIS — F419 Anxiety disorder, unspecified: Secondary | ICD-10-CM | POA: Diagnosis not present

## 2019-08-03 DIAGNOSIS — F4322 Adjustment disorder with anxiety: Secondary | ICD-10-CM | POA: Diagnosis not present

## 2019-08-06 DIAGNOSIS — F4322 Adjustment disorder with anxiety: Secondary | ICD-10-CM | POA: Diagnosis not present

## 2019-09-03 ENCOUNTER — Encounter: Payer: Self-pay | Admitting: *Deleted

## 2019-09-03 DIAGNOSIS — F419 Anxiety disorder, unspecified: Secondary | ICD-10-CM | POA: Diagnosis not present

## 2019-09-05 ENCOUNTER — Ambulatory Visit (INDEPENDENT_AMBULATORY_CARE_PROVIDER_SITE_OTHER): Payer: BLUE CROSS/BLUE SHIELD | Admitting: Gastroenterology

## 2019-09-05 ENCOUNTER — Encounter: Payer: Self-pay | Admitting: Gastroenterology

## 2019-09-05 VITALS — BP 112/60 | HR 61 | Temp 98.6°F | Ht 63.0 in | Wt 128.0 lb

## 2019-09-05 DIAGNOSIS — K641 Second degree hemorrhoids: Secondary | ICD-10-CM

## 2019-09-05 DIAGNOSIS — R140 Abdominal distension (gaseous): Secondary | ICD-10-CM

## 2019-09-05 NOTE — Progress Notes (Signed)
PROCEDURE NOTE: The patient presents with symptomatic grade II  hemorrhoids, requesting rubber band ligation of his/her hemorrhoidal disease.  All risks, benefits and alternative forms of therapy were described and informed consent was obtained.   The anorectum was pre-medicated with 0.125% NTG and Recticare The decision was made to band the Right posterior internal hemorrhoid, and the [facility] O'Regan System was used to perform band ligation without complication.  Digital anorectal examination was then performed to assure proper positioning of the band, and to adjust the banded tissue as required.  The patient was discharged home without pain or other issues.  Dietary and behavioral recommendations were given and along with follow-up instructions.     The following adjunctive treatments were recommended:  Benefiber 1 teaspoon twice daily Trial of lactose-free diet  The patient will return in 2 to 4 weeks for  follow-up and possible additional banding as required. No complications were encountered and the patient tolerated the procedure well.  Iona Beard , MD [phone]

## 2019-09-05 NOTE — Patient Instructions (Addendum)
HEMORRHOID BANDING PROCEDURE    FOLLOW-UP CARE   1. The procedure you have had should have been relatively painless since the banding of the area involved does not have nerve endings and there is no pain sensation.  The rubber band cuts off the blood supply to the hemorrhoid and the band may fall off as soon as 48 hours after the banding (the band may occasionally be seen in the toilet bowl following a bowel movement). You may notice a temporary feeling of fullness in the rectum which should respond adequately to plain Tylenol or Motrin.  2. Following the banding, avoid strenuous exercise that evening and resume full activity the next day.  A sitz bath (soaking in a warm tub) or bidet is soothing, and can be useful for cleansing the area after bowel movements.     3. To avoid constipation, take two tablespoons of natural wheat bran, natural oat bran, flax, Benefiber or any over the counter fiber supplement and increase your water intake to 7-8 glasses daily.    4. Unless you have been prescribed anorectal medication, do not put anything inside your rectum for two weeks: No suppositories, enemas, fingers, etc.  5. Occasionally, you may have more bleeding than usual after the banding procedure.  This is often from the untreated hemorrhoids rather than the treated one.  Don't be concerned if there is a tablespoon or so of blood.  If there is more blood than this, lie flat with your bottom higher than your head and apply an ice pack to the area. If the bleeding does not stop within a half an hour or if you feel faint, call our office at [phone] or go to the emergency room.  6. Problems are not common; however, if there is a substantial amount of bleeding, severe pain, chills, fever or difficulty passing urine (very rare) or other problems, you should call us at [phone] or report to the nearest emergency room.  7. Do not stay seated continuously for more than 2-3 hours for a day or two  after the procedure.  Tighten your buttock muscles 10-15 times every two hours and take 10-15 deep breaths every 1-2 hours.  Do not spend more than a few minutes on the toilet if you cannot empty your bowel; instead re-visit the toilet at a later time.    Continue Fiber twice daily   Lactose Intolerance, Adult Lactose is a natural sugar that is found in dairy milk and dairy products such as cheese and yogurt. Lactose is digested by lactase, a protein (enzyme) in your small intestine. Some people do not produce enough lactase to digest lactose. This is called lactose intolerance. Lactose intolerance is different from milk allergy, which is a more serious reaction to the protein in milk. What are the causes? Causes of lactose intolerance may include:  Normal aging. The ability to produce lactase may lessen with age, causing lactose intolerance over time.  Being born without the ability to make lactase.  Digestive diseases such as gastroenteritis or inflammatory bowel disease (IBD).  Surgery or injury to your small intestine.  Infection in your intestines.  Certain antibiotic medicines and cancer treatments. What are the signs or symptoms? Lactose intolerance can cause discomfort within 30 minutes to 2 hours after you eat or drink something that contains lactose. Symptoms may include:  Nausea.  Diarrhea.  Cramps or pain in the abdomen.  A full, tight, or painful feeling in the abdomen (bloating).  Gas. How  is this diagnosed? This condition may be diagnosed based on:  Your symptoms and medical history.  Lactose tolerance test. This test involves drinking a lactose solution and then having blood tests to measure the amount of glucose in your blood. If your blood glucose level does not go up, it means your body is not able to digest the lactose.  Lactose breath test (hydrogen breath test). This test involves drinking a lactose solution and then exhaling into a type of bag while  you digest the solution. Having a lot of hydrogen in your breath can be a sign of lactose intolerance.  Stool acidity test. This involves drinking a lactose solution and then having your stool samples tested for bacteria. Having a lot of bacteria causes stool to be considered acidic, which is a sign of lactose intolerance. How is this treated? There is no treatment to improve your body's ability to produce lactase. However, you can manage your symptoms at home by:  Limiting or avoiding dairy milk, dairy products, and other sources of lactose.  Taking lactase tablets when you eat milk products. Lactase tablets are over-the-counter medicines that help to improve lactose digestion. You may also add lactase drops to regular milk.  Adjusting your diet, such as drinking lactose-free milk. Lactose tolerance varies from person to person. Some people may be able to eat or drink small amounts of products that contain lactose, and other people may need to avoid all foods and drinks that contain lactose. Talk with your health care provider about what treatment is best for you. Follow these instructions at home:  Limit or avoid foods, beverages, and medicines that contain lactose, as told by your health care provider. Keep track of which foods, beverages, or medicines cause symptoms so you can decide what to avoid in the future.  Read food and medicine labels carefully. Avoid products that contain: ? Lactose. ? Milk solids. ? Casein. ? Whey.  Take over-the-counter and prescription medicines (including lactase tablets) only as told by your health care provider.  If you stop eating and drinking dairy products (eliminate dairy from your diet), make sure to get enough protein, calcium, and vitamin D from other foods. Work with your health care provider or a diet and nutrition specialist (dietitian) to make sure you get enough of those nutrients.  Choose a milk substitute that is fortified with calcium and  vitamin D. ? Soy milk contains high-quality protein. ? Milks that are made from nuts or grains (such as almond milk and rice milk) contain very small amounts of protein.  Keep all follow-up visits as told by your health care provider. This is important. Contact a health care provider if:  You have no relief from your symptoms after you have eliminated milk products and other sources of lactose. Get help right away if:  You have blood in your stool.  You have severe abdomen (abdominal) pain. Summary  Lactose is a natural sugar that is found in dairy milk and dairy products such as cheese and yogurt. Lactose is digested by lactase, which is a protein (enzyme) in the small intestine.  Some people do not produce enough lactase to digest lactose. This is called lactose intolerance.  Lactose intolerance can cause discomfort within 30 minutes to 2 hours after you eat or drink something that contains lactose.  Limit or avoid foods, beverages, and medicines that contain lactose, as told by your health care provider. This information is not intended to replace advice given to you by your  health care provider. Make sure you discuss any questions you have with your health care provider. Document Revised: 07/15/2017 Document Reviewed: 03/18/2017 Elsevier Patient Education  [place].  I appreciate the  opportunity to care for you  Thank You   Harl Bowie , MD

## 2019-09-24 ENCOUNTER — Ambulatory Visit: Payer: BLUE CROSS/BLUE SHIELD | Admitting: Gastroenterology

## 2019-09-24 ENCOUNTER — Other Ambulatory Visit: Payer: Self-pay

## 2019-09-24 ENCOUNTER — Encounter: Payer: Self-pay | Admitting: Gastroenterology

## 2019-09-24 VITALS — BP 118/62 | HR 80 | Temp 97.8°F | Ht 63.0 in | Wt 126.0 lb

## 2019-09-24 DIAGNOSIS — K641 Second degree hemorrhoids: Secondary | ICD-10-CM | POA: Diagnosis not present

## 2019-09-24 NOTE — Progress Notes (Signed)
PROCEDURE NOTE: The patient presents with symptomatic grade II  hemorrhoids, requesting rubber band ligation of his/her hemorrhoidal disease.  All risks, benefits and alternative forms of therapy were described and informed consent was obtained.   The anorectum was pre-medicated with 0.125% NTG and Recticare The decision was made to band the Left lateral internal hemorrhoid, and the [facility] O'Regan System was used to perform band ligation without complication.  Digital anorectal examination was then performed to assure proper positioning of the band, and to adjust the banded tissue as required.  The patient was discharged home without pain or other issues.  Dietary and behavioral recommendations were given and along with follow-up instructions.     The following adjunctive treatments were recommended: Benefiber 1 tablespoon TID with meals  The patient will return for  follow-up and possible additional banding as needed. No complications were encountered and the patient tolerated the procedure well.  Iona Beard , MD [phone]

## 2019-09-24 NOTE — Patient Instructions (Signed)
HEMORRHOID BANDING PROCEDURE    FOLLOW-UP CARE   1. The procedure you have had should have been relatively painless since the banding of the area involved does not have nerve endings and there is no pain sensation.  The rubber band cuts off the blood supply to the hemorrhoid and the band may fall off as soon as 48 hours after the banding (the band may occasionally be seen in the toilet bowl following a bowel movement). You may notice a temporary feeling of fullness in the rectum which should respond adequately to plain Tylenol or Motrin.  2. Following the banding, avoid strenuous exercise that evening and resume full activity the next day.  A sitz bath (soaking in a warm tub) or bidet is soothing, and can be useful for cleansing the area after bowel movements.     3. To avoid constipation, take two tablespoons of natural wheat bran, natural oat bran, flax, Benefiber or any over the counter fiber supplement and increase your water intake to 7-8 glasses daily.    4. Unless you have been prescribed anorectal medication, do not put anything inside your rectum for two weeks: No suppositories, enemas, fingers, etc.  5. Occasionally, you may have more bleeding than usual after the banding procedure.  This is often from the untreated hemorrhoids rather than the treated one.  Don't be concerned if there is a tablespoon or so of blood.  If there is more blood than this, lie flat with your bottom higher than your head and apply an ice pack to the area. If the bleeding does not stop within a half an hour or if you feel faint, call our office at [phone] or go to the emergency room.  6. Problems are not common; however, if there is a substantial amount of bleeding, severe pain, chills, fever or difficulty passing urine (very rare) or other problems, you should call us at [phone] or report to the nearest emergency room.  7. Do not stay seated continuously for more than 2-3 hours for a day or two  after the procedure.  Tighten your buttock muscles 10-15 times every two hours and take 10-15 deep breaths every 1-2 hours.  Do not spend more than a few minutes on the toilet if you cannot empty your bowel; instead re-visit the toilet at a later time.    Follow up as needed   I appreciate the  opportunity to care for you  Thank You   Kavitha Nandigam , MD  

## 2019-10-08 DIAGNOSIS — Z20828 Contact with and (suspected) exposure to other viral communicable diseases: Secondary | ICD-10-CM | POA: Diagnosis not present

## 2019-10-09 DIAGNOSIS — F419 Anxiety disorder, unspecified: Secondary | ICD-10-CM | POA: Diagnosis not present

## 2019-10-17 DIAGNOSIS — F419 Anxiety disorder, unspecified: Secondary | ICD-10-CM | POA: Diagnosis not present

## 2019-10-29 DIAGNOSIS — Z20828 Contact with and (suspected) exposure to other viral communicable diseases: Secondary | ICD-10-CM | POA: Diagnosis not present

## 2019-10-31 DIAGNOSIS — F332 Major depressive disorder, recurrent severe without psychotic features: Secondary | ICD-10-CM | POA: Diagnosis not present

## 2019-10-31 DIAGNOSIS — F419 Anxiety disorder, unspecified: Secondary | ICD-10-CM | POA: Diagnosis not present

## 2019-11-07 DIAGNOSIS — F419 Anxiety disorder, unspecified: Secondary | ICD-10-CM | POA: Diagnosis not present

## 2019-11-07 DIAGNOSIS — F332 Major depressive disorder, recurrent severe without psychotic features: Secondary | ICD-10-CM | POA: Diagnosis not present

## 2019-11-14 DIAGNOSIS — F411 Generalized anxiety disorder: Secondary | ICD-10-CM | POA: Diagnosis not present

## 2019-11-14 DIAGNOSIS — F331 Major depressive disorder, recurrent, moderate: Secondary | ICD-10-CM | POA: Diagnosis not present

## 2019-11-28 DIAGNOSIS — F331 Major depressive disorder, recurrent, moderate: Secondary | ICD-10-CM | POA: Diagnosis not present

## 2019-11-28 DIAGNOSIS — F411 Generalized anxiety disorder: Secondary | ICD-10-CM | POA: Diagnosis not present

## 2019-12-11 DIAGNOSIS — F419 Anxiety disorder, unspecified: Secondary | ICD-10-CM | POA: Diagnosis not present

## 2019-12-11 DIAGNOSIS — F411 Generalized anxiety disorder: Secondary | ICD-10-CM | POA: Diagnosis not present

## 2019-12-11 DIAGNOSIS — F331 Major depressive disorder, recurrent, moderate: Secondary | ICD-10-CM | POA: Diagnosis not present

## 2019-12-19 DIAGNOSIS — F331 Major depressive disorder, recurrent, moderate: Secondary | ICD-10-CM | POA: Diagnosis not present

## 2019-12-19 DIAGNOSIS — F411 Generalized anxiety disorder: Secondary | ICD-10-CM | POA: Diagnosis not present

## 2019-12-20 DIAGNOSIS — F331 Major depressive disorder, recurrent, moderate: Secondary | ICD-10-CM | POA: Diagnosis not present

## 2019-12-20 DIAGNOSIS — F411 Generalized anxiety disorder: Secondary | ICD-10-CM | POA: Diagnosis not present

## 2020-01-02 DIAGNOSIS — F411 Generalized anxiety disorder: Secondary | ICD-10-CM | POA: Diagnosis not present

## 2020-01-02 DIAGNOSIS — F332 Major depressive disorder, recurrent severe without psychotic features: Secondary | ICD-10-CM | POA: Diagnosis not present

## 2020-01-16 DIAGNOSIS — F331 Major depressive disorder, recurrent, moderate: Secondary | ICD-10-CM | POA: Diagnosis not present

## 2020-01-16 DIAGNOSIS — F411 Generalized anxiety disorder: Secondary | ICD-10-CM | POA: Diagnosis not present

## 2020-01-24 DIAGNOSIS — F331 Major depressive disorder, recurrent, moderate: Secondary | ICD-10-CM | POA: Diagnosis not present

## 2020-01-24 DIAGNOSIS — F411 Generalized anxiety disorder: Secondary | ICD-10-CM | POA: Diagnosis not present

## 2020-01-30 DIAGNOSIS — F331 Major depressive disorder, recurrent, moderate: Secondary | ICD-10-CM | POA: Diagnosis not present

## 2020-01-30 DIAGNOSIS — F411 Generalized anxiety disorder: Secondary | ICD-10-CM | POA: Diagnosis not present

## 2020-02-04 DIAGNOSIS — Z01419 Encounter for gynecological examination (general) (routine) without abnormal findings: Secondary | ICD-10-CM | POA: Diagnosis not present

## 2020-02-04 DIAGNOSIS — Z1231 Encounter for screening mammogram for malignant neoplasm of breast: Secondary | ICD-10-CM | POA: Diagnosis not present

## 2020-02-04 DIAGNOSIS — Z6821 Body mass index (BMI) 21.0-21.9, adult: Secondary | ICD-10-CM | POA: Diagnosis not present

## 2020-02-13 DIAGNOSIS — F331 Major depressive disorder, recurrent, moderate: Secondary | ICD-10-CM | POA: Diagnosis not present

## 2020-02-13 DIAGNOSIS — F411 Generalized anxiety disorder: Secondary | ICD-10-CM | POA: Diagnosis not present

## 2020-03-05 DIAGNOSIS — F411 Generalized anxiety disorder: Secondary | ICD-10-CM | POA: Diagnosis not present

## 2020-03-05 DIAGNOSIS — F331 Major depressive disorder, recurrent, moderate: Secondary | ICD-10-CM | POA: Diagnosis not present

## 2020-03-14 DIAGNOSIS — D649 Anemia, unspecified: Secondary | ICD-10-CM | POA: Diagnosis not present

## 2020-03-19 DIAGNOSIS — F411 Generalized anxiety disorder: Secondary | ICD-10-CM | POA: Diagnosis not present

## 2020-03-19 DIAGNOSIS — F331 Major depressive disorder, recurrent, moderate: Secondary | ICD-10-CM | POA: Diagnosis not present

## 2020-04-02 DIAGNOSIS — F411 Generalized anxiety disorder: Secondary | ICD-10-CM | POA: Diagnosis not present

## 2020-04-02 DIAGNOSIS — F331 Major depressive disorder, recurrent, moderate: Secondary | ICD-10-CM | POA: Diagnosis not present

## 2020-04-03 DIAGNOSIS — F411 Generalized anxiety disorder: Secondary | ICD-10-CM | POA: Diagnosis not present

## 2020-04-03 DIAGNOSIS — F331 Major depressive disorder, recurrent, moderate: Secondary | ICD-10-CM | POA: Diagnosis not present

## 2020-04-16 DIAGNOSIS — F331 Major depressive disorder, recurrent, moderate: Secondary | ICD-10-CM | POA: Diagnosis not present

## 2020-04-16 DIAGNOSIS — F411 Generalized anxiety disorder: Secondary | ICD-10-CM | POA: Diagnosis not present

## 2020-04-28 DIAGNOSIS — F331 Major depressive disorder, recurrent, moderate: Secondary | ICD-10-CM | POA: Diagnosis not present

## 2020-04-28 DIAGNOSIS — F411 Generalized anxiety disorder: Secondary | ICD-10-CM | POA: Diagnosis not present

## 2020-05-07 DIAGNOSIS — F411 Generalized anxiety disorder: Secondary | ICD-10-CM | POA: Diagnosis not present

## 2020-05-07 DIAGNOSIS — F331 Major depressive disorder, recurrent, moderate: Secondary | ICD-10-CM | POA: Diagnosis not present

## 2020-05-12 DIAGNOSIS — F411 Generalized anxiety disorder: Secondary | ICD-10-CM | POA: Diagnosis not present

## 2020-05-12 DIAGNOSIS — F331 Major depressive disorder, recurrent, moderate: Secondary | ICD-10-CM | POA: Diagnosis not present

## 2020-05-24 DIAGNOSIS — L2389 Allergic contact dermatitis due to other agents: Secondary | ICD-10-CM | POA: Diagnosis not present

## 2020-05-24 DIAGNOSIS — H1033 Unspecified acute conjunctivitis, bilateral: Secondary | ICD-10-CM | POA: Diagnosis not present

## 2020-05-26 DIAGNOSIS — F411 Generalized anxiety disorder: Secondary | ICD-10-CM | POA: Diagnosis not present

## 2020-05-26 DIAGNOSIS — F331 Major depressive disorder, recurrent, moderate: Secondary | ICD-10-CM | POA: Diagnosis not present

## 2020-05-28 DIAGNOSIS — Z79899 Other long term (current) drug therapy: Secondary | ICD-10-CM | POA: Diagnosis not present

## 2020-05-28 DIAGNOSIS — G43719 Chronic migraine without aura, intractable, without status migrainosus: Secondary | ICD-10-CM | POA: Diagnosis not present

## 2020-05-28 DIAGNOSIS — H539 Unspecified visual disturbance: Secondary | ICD-10-CM | POA: Diagnosis not present

## 2020-06-03 DIAGNOSIS — H35013 Changes in retinal vascular appearance, bilateral: Secondary | ICD-10-CM | POA: Diagnosis not present

## 2020-06-03 DIAGNOSIS — H5319 Other subjective visual disturbances: Secondary | ICD-10-CM | POA: Diagnosis not present

## 2020-06-03 DIAGNOSIS — H25013 Cortical age-related cataract, bilateral: Secondary | ICD-10-CM | POA: Diagnosis not present

## 2020-06-03 DIAGNOSIS — L308 Other specified dermatitis: Secondary | ICD-10-CM | POA: Diagnosis not present

## 2020-06-03 DIAGNOSIS — H04123 Dry eye syndrome of bilateral lacrimal glands: Secondary | ICD-10-CM | POA: Diagnosis not present

## 2020-06-09 DIAGNOSIS — F419 Anxiety disorder, unspecified: Secondary | ICD-10-CM | POA: Diagnosis not present

## 2020-06-09 DIAGNOSIS — F411 Generalized anxiety disorder: Secondary | ICD-10-CM | POA: Diagnosis not present

## 2020-06-09 DIAGNOSIS — F332 Major depressive disorder, recurrent severe without psychotic features: Secondary | ICD-10-CM | POA: Diagnosis not present

## 2020-06-16 DIAGNOSIS — F332 Major depressive disorder, recurrent severe without psychotic features: Secondary | ICD-10-CM | POA: Diagnosis not present

## 2020-06-16 DIAGNOSIS — F411 Generalized anxiety disorder: Secondary | ICD-10-CM | POA: Diagnosis not present

## 2020-06-16 DIAGNOSIS — F331 Major depressive disorder, recurrent, moderate: Secondary | ICD-10-CM | POA: Diagnosis not present

## 2020-06-30 DIAGNOSIS — F331 Major depressive disorder, recurrent, moderate: Secondary | ICD-10-CM | POA: Diagnosis not present

## 2020-06-30 DIAGNOSIS — F411 Generalized anxiety disorder: Secondary | ICD-10-CM | POA: Diagnosis not present

## 2020-07-14 DIAGNOSIS — F411 Generalized anxiety disorder: Secondary | ICD-10-CM | POA: Diagnosis not present

## 2020-07-14 DIAGNOSIS — F331 Major depressive disorder, recurrent, moderate: Secondary | ICD-10-CM | POA: Diagnosis not present

## 2020-08-06 DIAGNOSIS — L738 Other specified follicular disorders: Secondary | ICD-10-CM | POA: Diagnosis not present

## 2020-08-06 DIAGNOSIS — L821 Other seborrheic keratosis: Secondary | ICD-10-CM | POA: Diagnosis not present

## 2020-08-06 DIAGNOSIS — L720 Epidermal cyst: Secondary | ICD-10-CM | POA: Diagnosis not present

## 2020-08-18 DIAGNOSIS — F411 Generalized anxiety disorder: Secondary | ICD-10-CM | POA: Diagnosis not present

## 2020-08-18 DIAGNOSIS — F331 Major depressive disorder, recurrent, moderate: Secondary | ICD-10-CM | POA: Diagnosis not present

## 2020-09-01 DIAGNOSIS — F411 Generalized anxiety disorder: Secondary | ICD-10-CM | POA: Diagnosis not present

## 2020-09-01 DIAGNOSIS — F331 Major depressive disorder, recurrent, moderate: Secondary | ICD-10-CM | POA: Diagnosis not present

## 2020-09-02 DIAGNOSIS — F411 Generalized anxiety disorder: Secondary | ICD-10-CM | POA: Diagnosis not present

## 2020-09-02 DIAGNOSIS — F331 Major depressive disorder, recurrent, moderate: Secondary | ICD-10-CM | POA: Diagnosis not present

## 2020-09-08 ENCOUNTER — Other Ambulatory Visit: Payer: BLUE CROSS/BLUE SHIELD

## 2020-09-22 DIAGNOSIS — F331 Major depressive disorder, recurrent, moderate: Secondary | ICD-10-CM | POA: Diagnosis not present

## 2020-09-22 DIAGNOSIS — F411 Generalized anxiety disorder: Secondary | ICD-10-CM | POA: Diagnosis not present

## 2020-10-07 DIAGNOSIS — F411 Generalized anxiety disorder: Secondary | ICD-10-CM | POA: Diagnosis not present

## 2020-10-07 DIAGNOSIS — F331 Major depressive disorder, recurrent, moderate: Secondary | ICD-10-CM | POA: Diagnosis not present

## 2020-10-16 DIAGNOSIS — B029 Zoster without complications: Secondary | ICD-10-CM | POA: Diagnosis not present

## 2020-10-20 DIAGNOSIS — F331 Major depressive disorder, recurrent, moderate: Secondary | ICD-10-CM | POA: Diagnosis not present

## 2020-10-20 DIAGNOSIS — F411 Generalized anxiety disorder: Secondary | ICD-10-CM | POA: Diagnosis not present

## 2020-11-05 DIAGNOSIS — F332 Major depressive disorder, recurrent severe without psychotic features: Secondary | ICD-10-CM | POA: Diagnosis not present

## 2020-11-05 DIAGNOSIS — F411 Generalized anxiety disorder: Secondary | ICD-10-CM | POA: Diagnosis not present

## 2020-11-11 DIAGNOSIS — F411 Generalized anxiety disorder: Secondary | ICD-10-CM | POA: Diagnosis not present

## 2020-11-11 DIAGNOSIS — F331 Major depressive disorder, recurrent, moderate: Secondary | ICD-10-CM | POA: Diagnosis not present

## 2020-11-17 DIAGNOSIS — F411 Generalized anxiety disorder: Secondary | ICD-10-CM | POA: Diagnosis not present

## 2020-11-17 DIAGNOSIS — D509 Iron deficiency anemia, unspecified: Secondary | ICD-10-CM | POA: Diagnosis not present

## 2020-11-17 DIAGNOSIS — G43909 Migraine, unspecified, not intractable, without status migrainosus: Secondary | ICD-10-CM | POA: Diagnosis not present

## 2020-11-17 DIAGNOSIS — B0229 Other postherpetic nervous system involvement: Secondary | ICD-10-CM | POA: Diagnosis not present

## 2020-11-28 DIAGNOSIS — G43719 Chronic migraine without aura, intractable, without status migrainosus: Secondary | ICD-10-CM | POA: Diagnosis not present

## 2020-11-28 DIAGNOSIS — B0229 Other postherpetic nervous system involvement: Secondary | ICD-10-CM | POA: Diagnosis not present

## 2020-12-04 DIAGNOSIS — F411 Generalized anxiety disorder: Secondary | ICD-10-CM | POA: Diagnosis not present

## 2020-12-04 DIAGNOSIS — F331 Major depressive disorder, recurrent, moderate: Secondary | ICD-10-CM | POA: Diagnosis not present

## 2020-12-09 DIAGNOSIS — F331 Major depressive disorder, recurrent, moderate: Secondary | ICD-10-CM | POA: Diagnosis not present

## 2020-12-09 DIAGNOSIS — F411 Generalized anxiety disorder: Secondary | ICD-10-CM | POA: Diagnosis not present

## 2020-12-17 DIAGNOSIS — F331 Major depressive disorder, recurrent, moderate: Secondary | ICD-10-CM | POA: Diagnosis not present

## 2020-12-17 DIAGNOSIS — F411 Generalized anxiety disorder: Secondary | ICD-10-CM | POA: Diagnosis not present

## 2021-01-13 DIAGNOSIS — F411 Generalized anxiety disorder: Secondary | ICD-10-CM | POA: Diagnosis not present

## 2021-01-13 DIAGNOSIS — F331 Major depressive disorder, recurrent, moderate: Secondary | ICD-10-CM | POA: Diagnosis not present

## 2021-01-16 DIAGNOSIS — F332 Major depressive disorder, recurrent severe without psychotic features: Secondary | ICD-10-CM | POA: Diagnosis not present

## 2021-01-16 DIAGNOSIS — F419 Anxiety disorder, unspecified: Secondary | ICD-10-CM | POA: Diagnosis not present

## 2021-01-16 DIAGNOSIS — F411 Generalized anxiety disorder: Secondary | ICD-10-CM | POA: Diagnosis not present

## 2021-01-29 DIAGNOSIS — Z0000 Encounter for general adult medical examination without abnormal findings: Secondary | ICD-10-CM | POA: Diagnosis not present

## 2021-02-03 DIAGNOSIS — Z0000 Encounter for general adult medical examination without abnormal findings: Secondary | ICD-10-CM | POA: Diagnosis not present

## 2021-02-03 DIAGNOSIS — G43909 Migraine, unspecified, not intractable, without status migrainosus: Secondary | ICD-10-CM | POA: Diagnosis not present

## 2021-02-03 DIAGNOSIS — F411 Generalized anxiety disorder: Secondary | ICD-10-CM | POA: Diagnosis not present

## 2021-02-03 DIAGNOSIS — D72819 Decreased white blood cell count, unspecified: Secondary | ICD-10-CM | POA: Diagnosis not present

## 2021-02-03 DIAGNOSIS — B0229 Other postherpetic nervous system involvement: Secondary | ICD-10-CM | POA: Diagnosis not present

## 2021-02-05 ENCOUNTER — Other Ambulatory Visit: Payer: Self-pay | Admitting: Internal Medicine

## 2021-02-05 DIAGNOSIS — E7800 Pure hypercholesterolemia, unspecified: Secondary | ICD-10-CM

## 2021-02-13 DIAGNOSIS — F411 Generalized anxiety disorder: Secondary | ICD-10-CM | POA: Diagnosis not present

## 2021-02-13 DIAGNOSIS — F331 Major depressive disorder, recurrent, moderate: Secondary | ICD-10-CM | POA: Diagnosis not present

## 2021-03-04 ENCOUNTER — Ambulatory Visit
Admission: RE | Admit: 2021-03-04 | Discharge: 2021-03-04 | Disposition: A | Discharge status: Home / Self Care | Payer: No Typology Code available for payment source | Source: Ambulatory Visit | Attending: Internal Medicine | Admitting: Internal Medicine

## 2021-03-04 DIAGNOSIS — F331 Major depressive disorder, recurrent, moderate: Secondary | ICD-10-CM | POA: Diagnosis not present

## 2021-03-04 DIAGNOSIS — F411 Generalized anxiety disorder: Secondary | ICD-10-CM | POA: Diagnosis not present

## 2021-03-04 DIAGNOSIS — E7800 Pure hypercholesterolemia, unspecified: Secondary | ICD-10-CM

## 2021-03-24 ENCOUNTER — Other Ambulatory Visit: Payer: Self-pay | Admitting: Internal Medicine

## 2021-03-24 DIAGNOSIS — K7689 Other specified diseases of liver: Secondary | ICD-10-CM

## 2021-03-30 DIAGNOSIS — F411 Generalized anxiety disorder: Secondary | ICD-10-CM | POA: Diagnosis not present

## 2021-03-30 DIAGNOSIS — F331 Major depressive disorder, recurrent, moderate: Secondary | ICD-10-CM | POA: Diagnosis not present

## 2021-04-02 ENCOUNTER — Ambulatory Visit
Admission: RE | Admit: 2021-04-02 | Discharge: 2021-04-02 | Disposition: A | Discharge status: Home / Self Care | Payer: BC Managed Care – PPO | Source: Ambulatory Visit | Attending: Internal Medicine | Admitting: Internal Medicine

## 2021-04-02 ENCOUNTER — Other Ambulatory Visit: Payer: Self-pay

## 2021-04-02 DIAGNOSIS — K7689 Other specified diseases of liver: Secondary | ICD-10-CM

## 2021-04-09 DIAGNOSIS — M25512 Pain in left shoulder: Secondary | ICD-10-CM | POA: Diagnosis not present

## 2021-04-09 DIAGNOSIS — R0789 Other chest pain: Secondary | ICD-10-CM | POA: Diagnosis not present

## 2021-04-09 DIAGNOSIS — R051 Acute cough: Secondary | ICD-10-CM | POA: Diagnosis not present

## 2021-04-09 DIAGNOSIS — R0602 Shortness of breath: Secondary | ICD-10-CM | POA: Diagnosis not present

## 2021-04-16 DIAGNOSIS — F411 Generalized anxiety disorder: Secondary | ICD-10-CM | POA: Diagnosis not present

## 2021-04-16 DIAGNOSIS — F331 Major depressive disorder, recurrent, moderate: Secondary | ICD-10-CM | POA: Diagnosis not present

## 2021-05-13 DIAGNOSIS — Z1231 Encounter for screening mammogram for malignant neoplasm of breast: Secondary | ICD-10-CM | POA: Diagnosis not present

## 2021-05-13 DIAGNOSIS — N952 Postmenopausal atrophic vaginitis: Secondary | ICD-10-CM | POA: Diagnosis not present

## 2021-05-13 DIAGNOSIS — Z01419 Encounter for gynecological examination (general) (routine) without abnormal findings: Secondary | ICD-10-CM | POA: Diagnosis not present

## 2021-05-13 DIAGNOSIS — Z6822 Body mass index (BMI) 22.0-22.9, adult: Secondary | ICD-10-CM | POA: Diagnosis not present

## 2021-05-20 DIAGNOSIS — F4312 Post-traumatic stress disorder, chronic: Secondary | ICD-10-CM | POA: Diagnosis not present

## 2021-06-03 DIAGNOSIS — F4312 Post-traumatic stress disorder, chronic: Secondary | ICD-10-CM | POA: Diagnosis not present

## 2021-06-17 DIAGNOSIS — F4312 Post-traumatic stress disorder, chronic: Secondary | ICD-10-CM | POA: Diagnosis not present

## 2021-06-23 DIAGNOSIS — F331 Major depressive disorder, recurrent, moderate: Secondary | ICD-10-CM | POA: Diagnosis not present

## 2021-06-23 DIAGNOSIS — F411 Generalized anxiety disorder: Secondary | ICD-10-CM | POA: Diagnosis not present

## 2021-07-01 DIAGNOSIS — F4312 Post-traumatic stress disorder, chronic: Secondary | ICD-10-CM | POA: Diagnosis not present

## 2021-07-14 DIAGNOSIS — F4312 Post-traumatic stress disorder, chronic: Secondary | ICD-10-CM | POA: Diagnosis not present

## 2021-07-23 DIAGNOSIS — B0229 Other postherpetic nervous system involvement: Secondary | ICD-10-CM | POA: Diagnosis not present

## 2021-07-30 DIAGNOSIS — J029 Acute pharyngitis, unspecified: Secondary | ICD-10-CM | POA: Diagnosis not present

## 2021-08-12 DIAGNOSIS — F4312 Post-traumatic stress disorder, chronic: Secondary | ICD-10-CM | POA: Diagnosis not present

## 2021-09-11 ENCOUNTER — Ambulatory Visit (INDEPENDENT_AMBULATORY_CARE_PROVIDER_SITE_OTHER): Payer: BC Managed Care – PPO

## 2021-09-11 ENCOUNTER — Encounter (HOSPITAL_COMMUNITY): Payer: Self-pay

## 2021-09-11 ENCOUNTER — Ambulatory Visit (HOSPITAL_COMMUNITY)
Admission: EM | Admit: 2021-09-11 | Discharge: 2021-09-11 | Disposition: A | Discharge status: Home / Self Care | Payer: BC Managed Care – PPO | Attending: Family Medicine | Admitting: Family Medicine

## 2021-09-11 ENCOUNTER — Other Ambulatory Visit: Payer: Self-pay

## 2021-09-11 DIAGNOSIS — R051 Acute cough: Secondary | ICD-10-CM | POA: Diagnosis not present

## 2021-09-11 DIAGNOSIS — J40 Bronchitis, not specified as acute or chronic: Secondary | ICD-10-CM | POA: Diagnosis not present

## 2021-09-11 DIAGNOSIS — J329 Chronic sinusitis, unspecified: Secondary | ICD-10-CM | POA: Diagnosis not present

## 2021-09-11 DIAGNOSIS — R059 Cough, unspecified: Secondary | ICD-10-CM | POA: Diagnosis not present

## 2021-09-11 MED ORDER — AMOXICILLIN-POT CLAVULANATE 875-125 MG PO TABS: 1.0000 | ORAL_TABLET | Freq: Two times a day (BID) | ORAL | 0 refills | Status: DC

## 2021-09-11 MED ORDER — PREDNISONE 10 MG (21) PO TBPK: ORAL_TABLET | ORAL | 0 refills | Status: AC

## 2021-09-11 NOTE — ED Provider Notes (Signed)
[facility]    CSN: [id number] Arrival date & time: 09/11/21  1332      History   Chief Complaint Chief Complaint  Patient presents with   Cough    EAR ACHE    HPI Debbie Wade is a 56 y.o. female.   Patient presents today with a several week history of cough symptoms.  She was diagnosed with COVID-19 with an at-home test on 08/28/2021 and was prescribed Paxlovid she reports completing.  She initially had some improvement following use of this medication but then had recurrence of symptoms.  She continues to have cough, thoracic back pain with deep breathing, occasional shortness of breath.  Denies any nausea, vomiting, fever, chest pain.  She has been taking Mucinex without improvement of symptoms.  She has had COVID-19 vaccines including 2 boosters.  She denies additional known sick contacts.  Denies any recent antibiotic use.  She does have a history of allergies and uses as needed antihistamines for symptom control.  She reports a history of exercise-induced asthma but has not required albuterol in several years; denies history of hospitalization or previous intubation related to asthma.  She denies history of immunosuppression, diabetes, chronic liver/kidney disease, malignancy.  She does not smoke.   Past Medical History:  Diagnosis Date   Allergic rhinitis    Allergy    Anemia    Anxiety    Asthma    Depression    External hemorrhoids    Fibromyalgia    Internal hemorrhoids    Migraines    Osteoarthritis     Patient Active Problem List   Diagnosis Date Noted   EXTERNAL HEMORRHOIDS WITHOUT MENTION COMP 03/21/2009   HEMORRHAGE OF RECTUM AND ANUS 03/21/2009   DEPRESSION 05/08/2007   ALLERGIC RHINITIS 05/08/2007   ASTHMA 05/08/2007   HEADACHE 05/08/2007    Past Surgical History:  Procedure Laterality Date   COLONOSCOPY     LAPAROSCOPIC ASSISTED VAGINAL HYSTERECTOMY  2013,OCTOBER   none      OB History   No obstetric history on file.       Home Medications    Prior to Admission medications   Medication Sig Start Date End Date Taking? Authorizing Provider  amoxicillin-clavulanate (AUGMENTIN) 875-125 MG tablet Take 1 tablet by mouth every 12 (twelve) hours. 09/11/21  Yes Marquie Aderhold K, PA-C  predniSONE (STERAPRED UNI-PAK 21 TAB) 10 MG (21) TBPK tablet As directed 09/11/21  Yes Jaret Coppedge K, PA-C  almotriptan (AXERT) 12.5 MG tablet Take 12.5 mg by mouth as needed. may repeat in 2 hours if needed    [provider]  ALPRAZolam (XANAX) 0.25 MG tablet Take 0.25 mg by mouth as needed. Anxiety    [provider]  amitriptyline (ELAVIL) 10 MG tablet TAKE 3 TABLETS BY MOUTH EVERY DAY 06/23/15   [provider]  ibuprofen (ADVIL,MOTRIN) 200 MG tablet Take 600 mg by mouth every 6 (six) hours as needed. pain    [provider]  levETIRAcetam (KEPPRA) 500 MG tablet Take by mouth. 05/29/19 05/28/20  [provider]  Multiple Vitamin (MULTIVITAMIN WITH MINERALS) TABS Take 1 tablet by mouth daily.    [provider]  sertraline (ZOLOFT) 100 MG tablet Take 100 mg by mouth daily.    [provider]  VITAMIN D PO Take by mouth.    [provider]    Family History Family History  Problem Relation Age of Onset   Diabetes Maternal Grandmother    Heart failure Maternal  Grandmother    Hypertension Mother    Diabetes Maternal Grandfather    Heart disease Maternal Grandfather    Breast cancer Paternal Aunt    Colon cancer Neg Hx    Colon polyps Neg Hx    Esophageal cancer Neg Hx    Rectal cancer Neg Hx    Stomach cancer Neg Hx     Social History Social History   Tobacco Use   Smoking status: Never   Smokeless tobacco: Never  Vaping Use   Vaping Use: Never used  Substance Use Topics   Alcohol use: Yes    Comment: rare   Drug use: No     Allergies   Erythromycin   Review of Systems Review of Systems  Constitutional:  Positive for activity change.  Negative for appetite change, fatigue and fever.  HENT:  Positive for congestion, sinus pressure and sore throat. Negative for sneezing.   Respiratory:  Positive for cough and shortness of breath.   Cardiovascular:  Negative for chest pain.  Gastrointestinal:  Negative for abdominal pain, diarrhea, nausea and vomiting.  Musculoskeletal:  Positive for back pain. Negative for arthralgias and myalgias.  Neurological:  Positive for headaches. Negative for dizziness and light-headedness.    Physical Exam Triage Vital Signs ED Triage Vitals  Enc Vitals Group     BP 09/11/21 1356 (!) 146/84     Pulse Rate 09/11/21 1358 76     Resp 09/11/21 1356 16     Temp 09/11/21 1357 98.3 F (36.8 C)     Temp Source 09/11/21 1357 Oral     SpO2 09/11/21 1356 95 %     Weight --      Height --      Head Circumference --      Peak Flow --      Pain Score --      Pain Loc --      Pain Edu? --      Excl. in [place]? --    No data found.  Updated Vital Signs BP (!) 146/84 (BP Location: Left Arm)    Pulse 76    Temp 98.3 F (36.8 C) (Oral)    Resp 16    LMP 01/28/2012    SpO2 95%   Visual Acuity Right Eye Distance:   Left Eye Distance:   Bilateral Distance:    Right Eye Near:   Left Eye Near:    Bilateral Near:     Physical Exam Vitals reviewed.  Constitutional:      General: She is awake. She is not in acute distress.    Appearance: Normal appearance. She is well-developed. She is not ill-appearing.     Comments: Very pleasant female appears stated age in no acute distress sitting comfortably in exam room  HENT:     Head: Normocephalic and atraumatic.     Right Ear: Tympanic membrane, ear canal and external ear normal. Tympanic membrane is not erythematous or bulging.     Left Ear: Tympanic membrane, ear canal and external ear normal. Tympanic membrane is not erythematous or bulging.     Mouth/Throat:     Pharynx: Uvula midline. Posterior oropharyngeal erythema present. No oropharyngeal  exudate.     Comments: Erythema and drainage in posterior oropharynx Cardiovascular:     Rate and Rhythm: Normal rate and regular rhythm.     Heart sounds: Normal heart sounds, S1 normal and S2 normal. No murmur heard. Pulmonary:     Effort: Pulmonary effort is normal.  Breath sounds: Normal breath sounds. No wheezing, rhonchi or rales.     Comments: Clear to auscultation bilaterally Chest:     Chest wall: No deformity, swelling or tenderness.  Musculoskeletal:     Cervical back: No tenderness or bony tenderness.     Thoracic back: No tenderness or bony tenderness.     Lumbar back: No tenderness or bony tenderness.  Psychiatric:        Behavior: Behavior is cooperative.     UC Treatments / Results  Labs (all labs ordered are listed, but only abnormal results are displayed) Labs Reviewed - No data to display  EKG   Radiology DG Chest 2 View  Result Date: 09/11/2021 CLINICAL DATA:  Cough for 2 weeks following COVID infection. EXAM: CHEST - 2 VIEW COMPARISON:  Cardiac CT 03/04/2021.  Chest radiographs 08/03/2006. FINDINGS: The heart size and mediastinal contours are normal. The lungs are clear. There is no pleural effusion or pneumothorax. No acute osseous findings are identified. IMPRESSION: Stable chest.  No active cardiopulmonary process. Electronically Signed   By: Richardean Sale M.D.   On: 09/11/2021 14:24    Procedures Procedures (including critical care time)  Medications Ordered in UC Medications - No data to display  Initial Impression / Assessment and Plan / UC Course  I have reviewed the triage vital signs and the nursing notes.  Pertinent labs & imaging results that were available during my care of the patient were reviewed by me and considered in my medical decision making (see chart for details).     No indication for viral testing given patient has been symptomatic for several weeks.  Chest x-ray was obtained that showed no active cardiopulmonary disease.   Given prolonged and worsening symptoms will cover with Augmentin as well start prednisone taper; denies history of diabetes.  She was instructed not to take NSAIDs with this medication due to risk of GI bleeding.  She can use Mucinex, Flonase, Tylenol for additional symptom relief.  Suspect that thoracic back pain is related to muscle strain from coughing; low suspicion for PE with Wells score of 0.  Discussed that if she does not have significant improvement of symptoms with anti-inflammatories over at any point anything worsens she needs to go to the emergency room.  Discussed that if she has worsening symptoms including chest pain, shortness of breath, high fever not responding to medication, nausea/vomiting interfering with oral intake she should be seen immediately.  If symptoms or not improved by early next week she should follow-up with [place] or her PCP.  Strict return precautions given to which she expressed understanding.  Final Clinical Impressions(s) / UC Diagnoses   Final diagnoses:  Sinobronchitis  Acute cough     Discharge Instructions      Your x-ray was normal.  I am going to start you on an antibiotic to cover for sinus/bronchitis.  Take Augmentin twice daily.  Start prednisone taper as prescribed.  Do not take NSAIDs including aspirin, ibuprofen/Advil, naproxen/Aleve with this medication as it can cause stomach bleeding.  You can use Mucinex, Flonase, Tylenol for symptom relief.  Make sure you rest and drink plenty of fluid.  If your symptoms or not improving by early next week please return here or see your PCP.  If you have any worsening symptoms including persistent/worsening pain in your back, development of chest pain, shortness of breath, severe cough, fever not responding to medication, nausea/vomiting interfering with oral intake you need to go to emergency room immediately.  ED Prescriptions     Medication Sig Dispense Auth. Provider   predniSONE (STERAPRED UNI-PAK 21  TAB) 10 MG (21) TBPK tablet As directed 21 tablet Yunique Dearcos K, PA-C   amoxicillin-clavulanate (AUGMENTIN) 875-125 MG tablet Take 1 tablet by mouth every 12 (twelve) hours. 14 tablet Audley Hinojos, Derry Skill, PA-C      PDMP not reviewed this encounter.   Terrilee Croak, PA-C 09/11/21 1442

## 2021-09-11 NOTE — Discharge Instructions (Signed)
Your x-ray was normal.  I am going to start you on an antibiotic to cover for sinus/bronchitis.  Take Augmentin twice daily.  Start prednisone taper as prescribed.  Do not take NSAIDs including aspirin, ibuprofen/Advil, naproxen/Aleve with this medication as it can cause stomach bleeding.  You can use Mucinex, Flonase, Tylenol for symptom relief.  Make sure you rest and drink plenty of fluid.  If your symptoms or not improving by early next week please return here or see your PCP.  If you have any worsening symptoms including persistent/worsening pain in your back, development of chest pain, shortness of breath, severe cough, fever not responding to medication, nausea/vomiting interfering with oral intake you need to go to emergency room immediately.

## 2021-09-11 NOTE — ED Triage Notes (Signed)
Pt presents to the office for cough x 1 month. She was diagnosis with covid in December 2022,since then she had fatigue and coughing.

## 2021-09-15 DIAGNOSIS — F331 Major depressive disorder, recurrent, moderate: Secondary | ICD-10-CM | POA: Diagnosis not present

## 2021-09-15 DIAGNOSIS — F411 Generalized anxiety disorder: Secondary | ICD-10-CM | POA: Diagnosis not present

## 2021-09-23 DIAGNOSIS — F4312 Post-traumatic stress disorder, chronic: Secondary | ICD-10-CM | POA: Diagnosis not present

## 2021-09-29 DIAGNOSIS — Z13820 Encounter for screening for osteoporosis: Secondary | ICD-10-CM | POA: Diagnosis not present

## 2021-10-08 DIAGNOSIS — F4312 Post-traumatic stress disorder, chronic: Secondary | ICD-10-CM | POA: Diagnosis not present

## 2021-10-29 DIAGNOSIS — F4312 Post-traumatic stress disorder, chronic: Secondary | ICD-10-CM | POA: Diagnosis not present

## 2021-11-19 DIAGNOSIS — F4312 Post-traumatic stress disorder, chronic: Secondary | ICD-10-CM | POA: Diagnosis not present

## 2021-11-25 DIAGNOSIS — G43719 Chronic migraine without aura, intractable, without status migrainosus: Secondary | ICD-10-CM | POA: Diagnosis not present

## 2021-11-25 DIAGNOSIS — Z79899 Other long term (current) drug therapy: Secondary | ICD-10-CM | POA: Diagnosis not present

## 2021-11-25 DIAGNOSIS — B0229 Other postherpetic nervous system involvement: Secondary | ICD-10-CM | POA: Diagnosis not present

## 2021-12-08 DIAGNOSIS — F411 Generalized anxiety disorder: Secondary | ICD-10-CM | POA: Diagnosis not present

## 2021-12-08 DIAGNOSIS — F331 Major depressive disorder, recurrent, moderate: Secondary | ICD-10-CM | POA: Diagnosis not present

## 2021-12-31 DIAGNOSIS — F4312 Post-traumatic stress disorder, chronic: Secondary | ICD-10-CM | POA: Diagnosis not present

## 2022-01-22 DIAGNOSIS — F4312 Post-traumatic stress disorder, chronic: Secondary | ICD-10-CM | POA: Diagnosis not present

## 2022-02-01 DIAGNOSIS — F411 Generalized anxiety disorder: Secondary | ICD-10-CM | POA: Diagnosis not present

## 2022-02-01 DIAGNOSIS — F331 Major depressive disorder, recurrent, moderate: Secondary | ICD-10-CM | POA: Diagnosis not present

## 2022-02-03 DIAGNOSIS — M797 Fibromyalgia: Secondary | ICD-10-CM | POA: Diagnosis not present

## 2022-02-03 DIAGNOSIS — E7800 Pure hypercholesterolemia, unspecified: Secondary | ICD-10-CM | POA: Diagnosis not present

## 2022-02-03 DIAGNOSIS — B0229 Other postherpetic nervous system involvement: Secondary | ICD-10-CM | POA: Diagnosis not present

## 2022-02-03 DIAGNOSIS — Z0000 Encounter for general adult medical examination without abnormal findings: Secondary | ICD-10-CM | POA: Diagnosis not present

## 2022-02-09 DIAGNOSIS — Z0000 Encounter for general adult medical examination without abnormal findings: Secondary | ICD-10-CM | POA: Diagnosis not present

## 2022-02-09 DIAGNOSIS — F411 Generalized anxiety disorder: Secondary | ICD-10-CM | POA: Diagnosis not present

## 2022-02-09 DIAGNOSIS — E7800 Pure hypercholesterolemia, unspecified: Secondary | ICD-10-CM | POA: Diagnosis not present

## 2022-02-09 DIAGNOSIS — G43909 Migraine, unspecified, not intractable, without status migrainosus: Secondary | ICD-10-CM | POA: Diagnosis not present

## 2022-02-09 DIAGNOSIS — K648 Other hemorrhoids: Secondary | ICD-10-CM | POA: Diagnosis not present

## 2022-02-10 IMAGING — DX DG CHEST 2V
2 series · 2 of 2 positions shown · non-contrast
Comparison: Cardiac CT 03/04/2021.  Chest radiographs 08/03/2006.

CLINICAL DATA: Cough for 2 weeks following COVID infection.

EXAM:
CHEST - 2 VIEW

[chest pa]
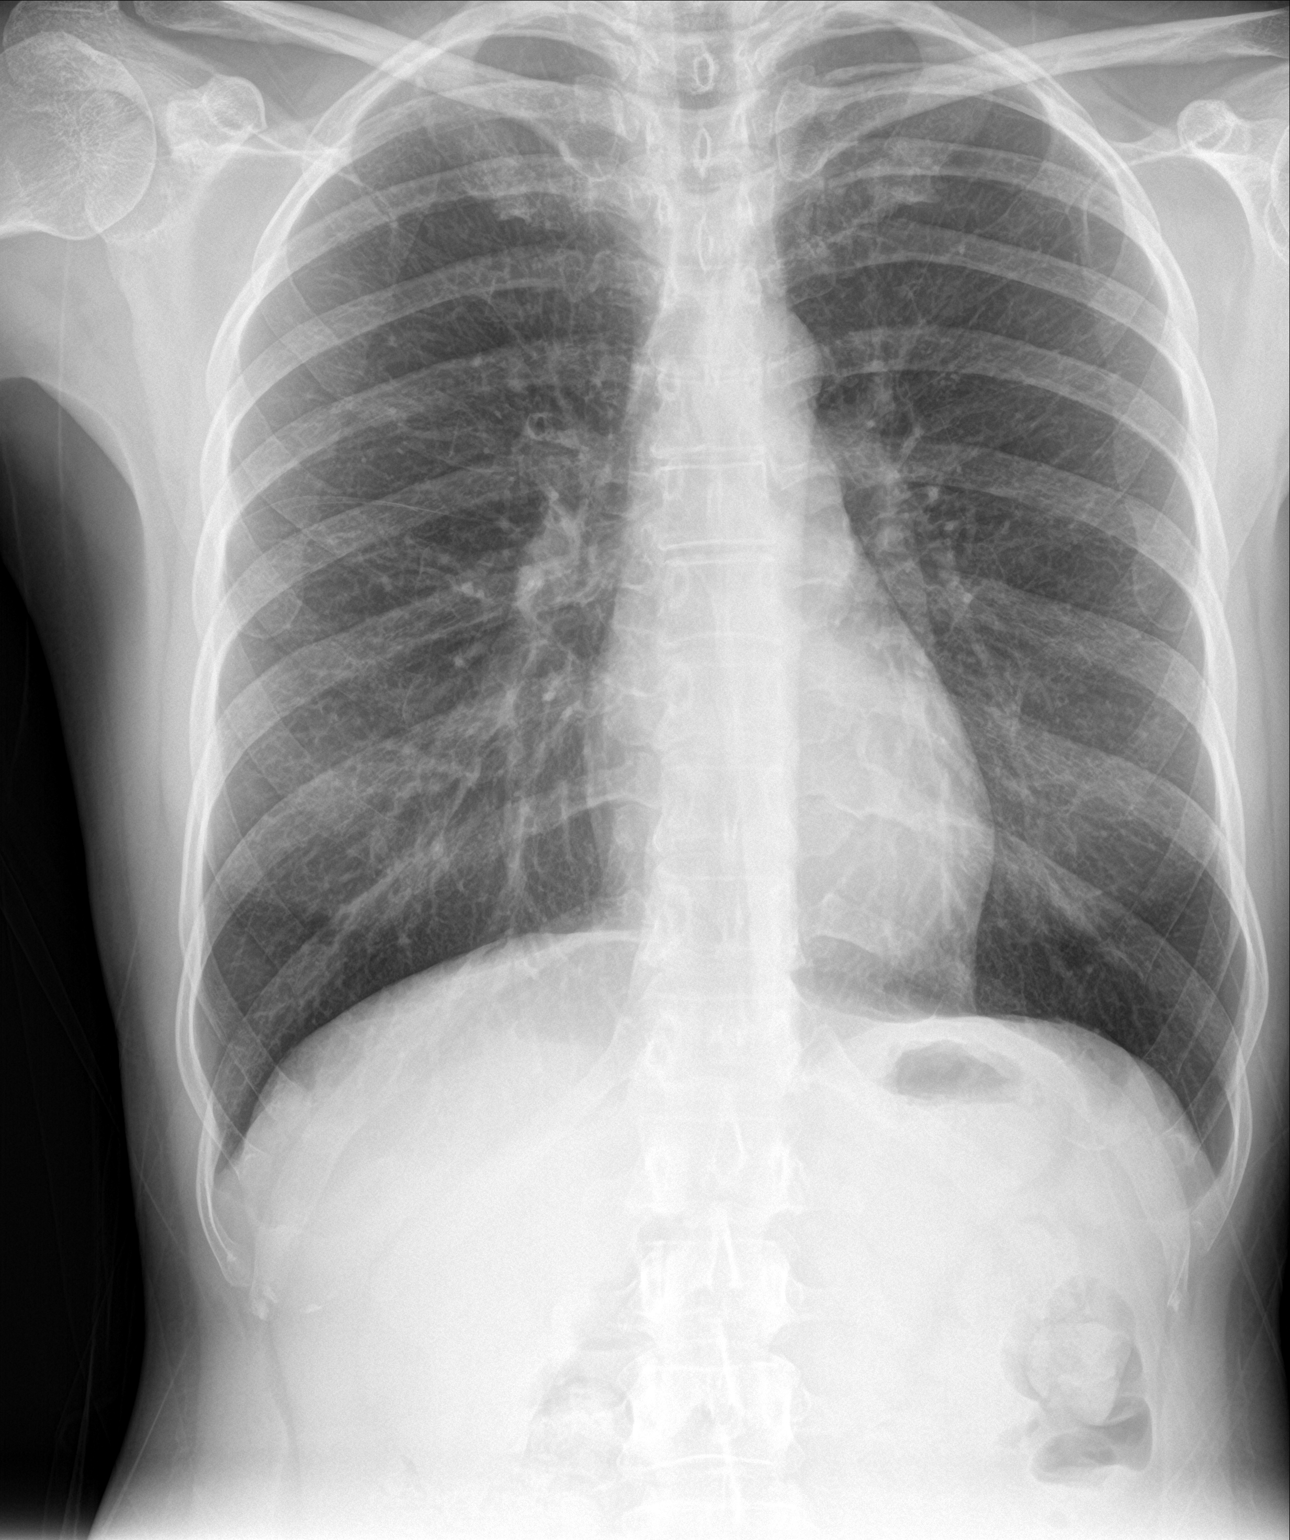

[chest lat]
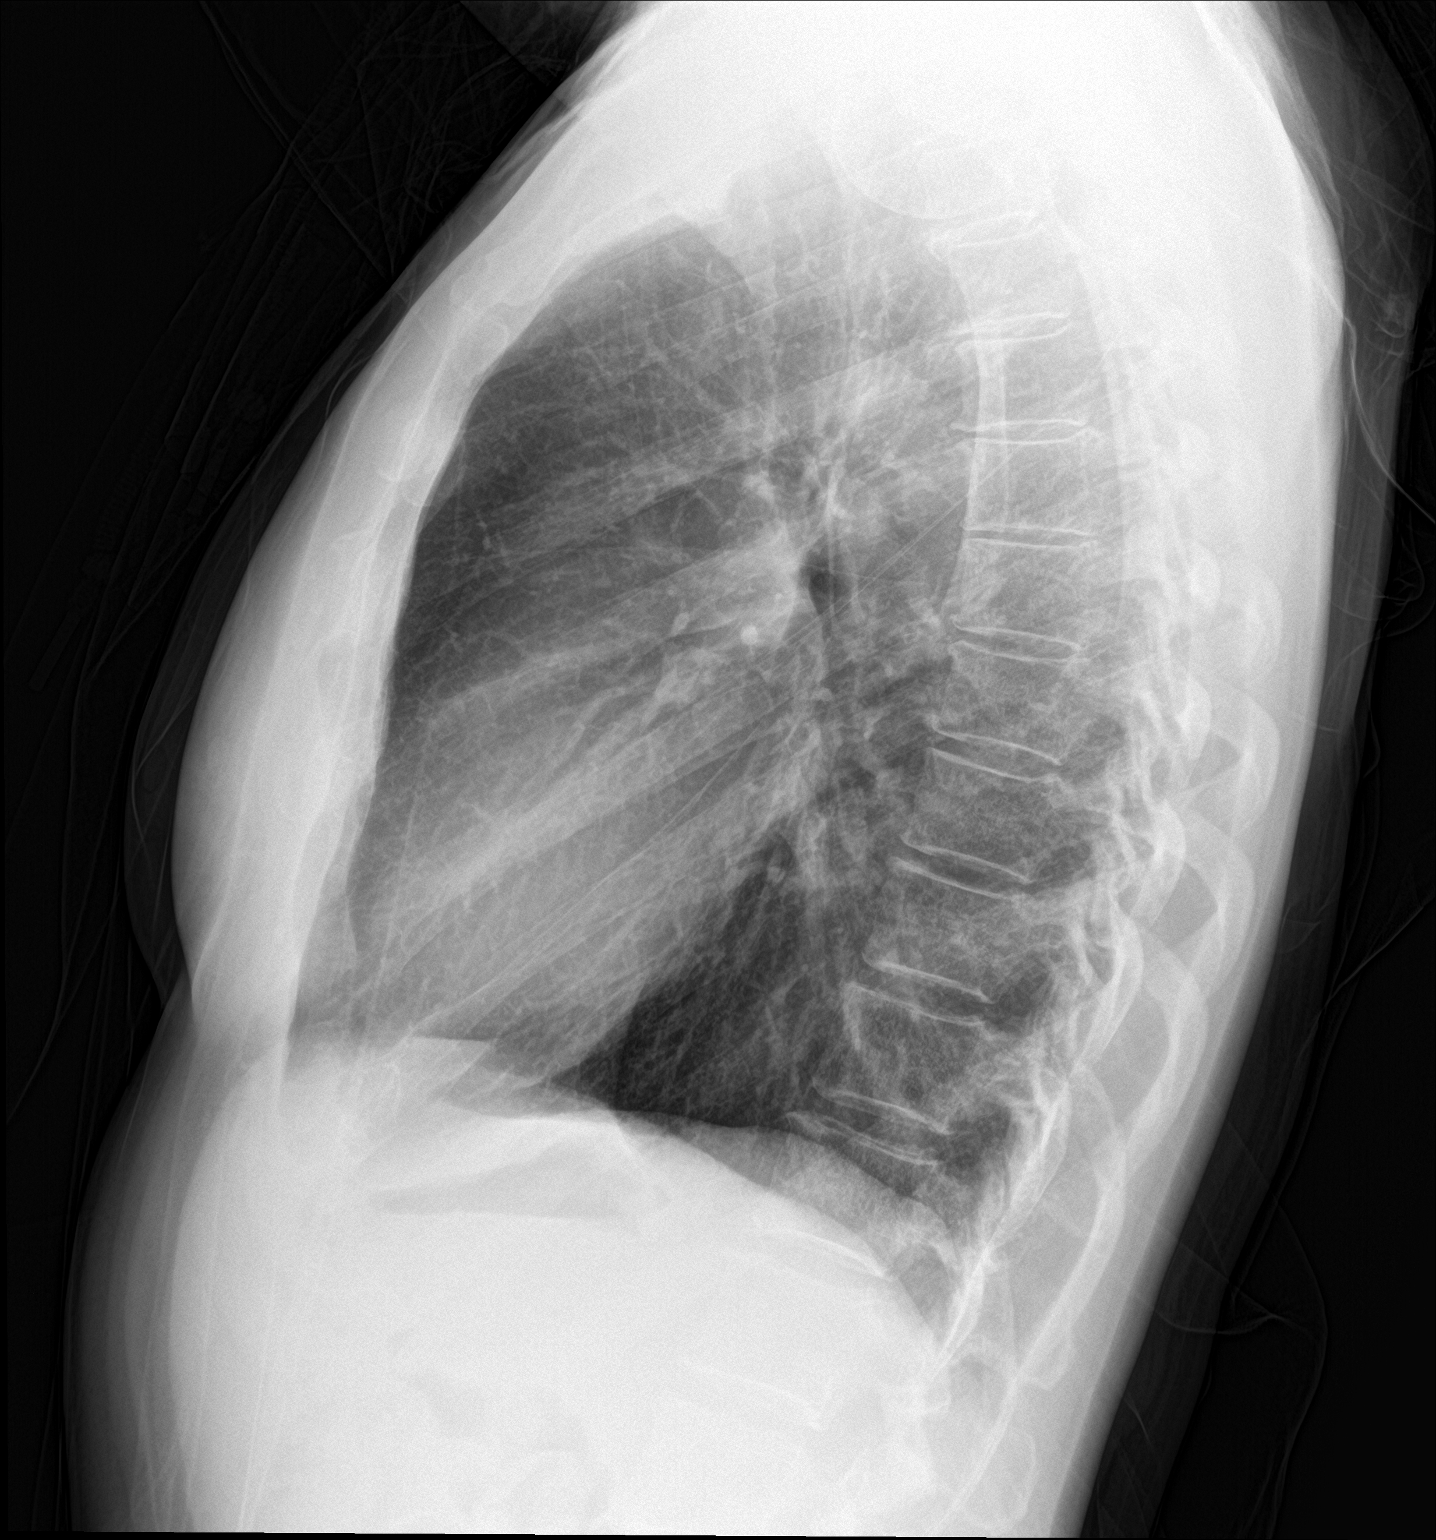

[2 of 2 positions shown; findings below may reference images not displayed]

FINDINGS: The heart size and mediastinal contours are normal. The lungs are
clear. There is no pleural effusion or pneumothorax. No acute
osseous findings are identified.
IMPRESSION: Stable chest.  No active cardiopulmonary process.

## 2022-03-08 DIAGNOSIS — G43719 Chronic migraine without aura, intractable, without status migrainosus: Secondary | ICD-10-CM | POA: Diagnosis not present

## 2022-03-08 DIAGNOSIS — Z79620 Long term (current) use of immunosuppressive biologic: Secondary | ICD-10-CM | POA: Diagnosis not present

## 2022-03-08 DIAGNOSIS — Z79899 Other long term (current) drug therapy: Secondary | ICD-10-CM | POA: Diagnosis not present

## 2022-03-08 DIAGNOSIS — B0229 Other postherpetic nervous system involvement: Secondary | ICD-10-CM | POA: Diagnosis not present

## 2022-03-23 DIAGNOSIS — F4312 Post-traumatic stress disorder, chronic: Secondary | ICD-10-CM | POA: Diagnosis not present

## 2022-04-05 DIAGNOSIS — F331 Major depressive disorder, recurrent, moderate: Secondary | ICD-10-CM | POA: Diagnosis not present

## 2022-04-05 DIAGNOSIS — F411 Generalized anxiety disorder: Secondary | ICD-10-CM | POA: Diagnosis not present

## 2022-04-15 DIAGNOSIS — K642 Third degree hemorrhoids: Secondary | ICD-10-CM | POA: Diagnosis not present

## 2022-05-24 DIAGNOSIS — M549 Dorsalgia, unspecified: Secondary | ICD-10-CM | POA: Diagnosis not present

## 2022-05-24 DIAGNOSIS — M1990 Unspecified osteoarthritis, unspecified site: Secondary | ICD-10-CM | POA: Diagnosis not present

## 2022-06-10 DIAGNOSIS — F4312 Post-traumatic stress disorder, chronic: Secondary | ICD-10-CM | POA: Diagnosis not present

## 2022-06-23 DIAGNOSIS — G43719 Chronic migraine without aura, intractable, without status migrainosus: Secondary | ICD-10-CM | POA: Diagnosis not present

## 2022-06-23 DIAGNOSIS — Z6823 Body mass index (BMI) 23.0-23.9, adult: Secondary | ICD-10-CM | POA: Diagnosis not present

## 2022-06-23 DIAGNOSIS — Z79620 Long term (current) use of immunosuppressive biologic: Secondary | ICD-10-CM | POA: Diagnosis not present

## 2022-06-23 DIAGNOSIS — Z01419 Encounter for gynecological examination (general) (routine) without abnormal findings: Secondary | ICD-10-CM | POA: Diagnosis not present

## 2022-06-23 DIAGNOSIS — Z79899 Other long term (current) drug therapy: Secondary | ICD-10-CM | POA: Diagnosis not present

## 2022-06-23 DIAGNOSIS — F411 Generalized anxiety disorder: Secondary | ICD-10-CM | POA: Diagnosis not present

## 2022-06-23 DIAGNOSIS — F331 Major depressive disorder, recurrent, moderate: Secondary | ICD-10-CM | POA: Diagnosis not present

## 2022-08-05 ENCOUNTER — Encounter (HOSPITAL_BASED_OUTPATIENT_CLINIC_OR_DEPARTMENT_OTHER): Payer: Self-pay | Admitting: Surgery

## 2022-08-05 DIAGNOSIS — Z6822 Body mass index (BMI) 22.0-22.9, adult: Secondary | ICD-10-CM | POA: Diagnosis not present

## 2022-08-05 DIAGNOSIS — R52 Pain, unspecified: Secondary | ICD-10-CM | POA: Diagnosis not present

## 2022-08-05 DIAGNOSIS — J00 Acute nasopharyngitis [common cold]: Secondary | ICD-10-CM | POA: Diagnosis not present

## 2022-08-05 DIAGNOSIS — R030 Elevated blood-pressure reading, without diagnosis of hypertension: Secondary | ICD-10-CM | POA: Diagnosis not present

## 2022-08-05 NOTE — Progress Notes (Signed)
Spoke w/ via phone for pre-op interview--- Debbie Wade needs dos----   NONE per anesthesia. Surgeon orders pending.            Wade results------ COVID test -----patient states asymptomatic no test needed Arrive at -------0530 NPO after MN NO Solid Food.   Med rec completed Medications to take morning of surgery ----- Zoloft and Xanax PRN Diabetic medication ----- Patient instructed no nail polish to be worn day of surgery. NO Metal. Jewelry or body piercings. Patient instructed to bring photo id and insurance card day of surgery Patient aware to have Driver (ride ) / caregiver Debbie Wade Husband   for 24 hours after surgery  Patient Special Instructions ----- Pre-Op special Istructions ----- Patient verbalized understanding of instructions that were given at this phone interview. Patient denies shortness of breath, chest pain, fever, cough at this phone interview.

## 2022-08-11 ENCOUNTER — Ambulatory Visit: Payer: Self-pay | Admitting: Surgery

## 2022-08-11 NOTE — Anesthesia Preprocedure Evaluation (Signed)
Anesthesia Evaluation  Patient identified by MRN, date of birth, ID band Patient awake    Reviewed: Allergy & Precautions, H&P , NPO status , Patient's Chart, lab work & pertinent test results  History of Anesthesia Complications (+) Family history of anesthesia reaction  Airway Mallampati: I  TM Distance: >3 FB Neck ROM: Full    Dental no notable dental hx. (+) Teeth Intact, Dental Advisory Given, Caps   Pulmonary neg pulmonary ROS, asthma    Pulmonary exam normal breath sounds clear to auscultation       Cardiovascular Exercise Tolerance: Good negative cardio ROS Normal cardiovascular exam Rhythm:Regular Rate:Normal     Neuro/Psych  Headaches PSYCHIATRIC DISORDERS Anxiety Depression     Neuromuscular disease negative neurological ROS  negative psych ROS   GI/Hepatic negative GI ROS, Neg liver ROS,,,  Endo/Other  negative endocrine ROS    Renal/GU negative Renal ROS  negative genitourinary   Musculoskeletal negative musculoskeletal ROS (+) Arthritis ,  Fibromyalgia -  Abdominal   Peds negative pediatric ROS (+)  Hematology negative hematology ROS (+) Blood dyscrasia, anemia   Anesthesia Other Findings   Reproductive/Obstetrics negative OB ROS                             Anesthesia Physical Anesthesia Plan  ASA: 2  Anesthesia Plan: General   Post-op Pain Management: Tylenol PO (pre-op)* and Celebrex PO (pre-op)*   Induction: Intravenous  PONV Risk Score and Plan: 3 and Ondansetron, Dexamethasone and Midazolam  Airway Management Planned: Oral ETT and LMA  Additional Equipment: None  Intra-op Plan:   Post-operative Plan: Extubation in OR  Informed Consent: I have reviewed the patients History and Physical, chart, labs and discussed the procedure including the risks, benefits and alternatives for the proposed anesthesia with the patient or authorized representative who has  indicated his/her understanding and acceptance.       Plan Discussed with: Anesthesiologist and CRNA  Anesthesia Plan Comments: (  )       Anesthesia Quick Evaluation

## 2022-08-12 ENCOUNTER — Other Ambulatory Visit: Payer: Self-pay

## 2022-08-12 ENCOUNTER — Ambulatory Visit (HOSPITAL_BASED_OUTPATIENT_CLINIC_OR_DEPARTMENT_OTHER): Payer: BC Managed Care – PPO | Admitting: Anesthesiology

## 2022-08-12 ENCOUNTER — Ambulatory Visit (HOSPITAL_BASED_OUTPATIENT_CLINIC_OR_DEPARTMENT_OTHER)
Admission: RE | Admit: 2022-08-12 | Discharge: 2022-08-12 | Disposition: A | Discharge status: Home / Self Care | Payer: BC Managed Care – PPO | Source: Ambulatory Visit | Attending: Surgery | Admitting: Surgery

## 2022-08-12 ENCOUNTER — Encounter (HOSPITAL_BASED_OUTPATIENT_CLINIC_OR_DEPARTMENT_OTHER)
Admission: RE | Disposition: A | Discharge status: Home / Self Care | Payer: Self-pay | Source: Ambulatory Visit | Attending: Surgery

## 2022-08-12 ENCOUNTER — Encounter (HOSPITAL_BASED_OUTPATIENT_CLINIC_OR_DEPARTMENT_OTHER): Payer: Self-pay | Admitting: Surgery

## 2022-08-12 DIAGNOSIS — R519 Headache, unspecified: Secondary | ICD-10-CM | POA: Diagnosis not present

## 2022-08-12 DIAGNOSIS — M797 Fibromyalgia: Secondary | ICD-10-CM | POA: Insufficient documentation

## 2022-08-12 DIAGNOSIS — M1990 Unspecified osteoarthritis, unspecified site: Secondary | ICD-10-CM | POA: Insufficient documentation

## 2022-08-12 DIAGNOSIS — F32A Depression, unspecified: Secondary | ICD-10-CM | POA: Insufficient documentation

## 2022-08-12 DIAGNOSIS — K644 Residual hemorrhoidal skin tags: Secondary | ICD-10-CM | POA: Diagnosis not present

## 2022-08-12 DIAGNOSIS — K649 Unspecified hemorrhoids: Secondary | ICD-10-CM | POA: Diagnosis not present

## 2022-08-12 DIAGNOSIS — K642 Third degree hemorrhoids: Secondary | ICD-10-CM | POA: Insufficient documentation

## 2022-08-12 DIAGNOSIS — K648 Other hemorrhoids: Secondary | ICD-10-CM | POA: Diagnosis not present

## 2022-08-12 DIAGNOSIS — D649 Anemia, unspecified: Secondary | ICD-10-CM | POA: Insufficient documentation

## 2022-08-12 DIAGNOSIS — N3941 Urge incontinence: Secondary | ICD-10-CM | POA: Insufficient documentation

## 2022-08-12 DIAGNOSIS — F419 Anxiety disorder, unspecified: Secondary | ICD-10-CM | POA: Diagnosis not present

## 2022-08-12 DIAGNOSIS — J45909 Unspecified asthma, uncomplicated: Secondary | ICD-10-CM | POA: Insufficient documentation

## 2022-08-12 HISTORY — PX: RECTAL EXAM UNDER ANESTHESIA: SHX6399

## 2022-08-12 HISTORY — PX: HEMORRHOID SURGERY: SHX153

## 2022-08-12 HISTORY — DX: Family history of other specified conditions: Z84.89

## 2022-08-12 SURGERY — HEMORRHOIDECTOMY
Anesthesia: General

## 2022-08-12 MED ORDER — SODIUM CHLORIDE (PF) 0.9 % IJ SOLN
INTRAMUSCULAR | Status: AC
  Filled 2022-08-12: qty 10

## 2022-08-12 MED ORDER — PROPOFOL 10 MG/ML IV BOLUS
INTRAVENOUS | Status: AC
  Filled 2022-08-12: qty 20

## 2022-08-12 MED ORDER — ACETAMINOPHEN 325 MG PO TABS: 325.0000 mg | ORAL_TABLET | ORAL | Status: DC | PRN

## 2022-08-12 MED ORDER — FENTANYL CITRATE (PF) 100 MCG/2ML IJ SOLN: 25.0000 ug | INTRAMUSCULAR | Status: DC | PRN

## 2022-08-12 MED ORDER — DIBUCAINE (PERIANAL) 1 % EX OINT
TOPICAL_OINTMENT | CUTANEOUS | Status: AC
  Filled 2022-08-12: qty 28

## 2022-08-12 MED ORDER — FENTANYL CITRATE (PF) 100 MCG/2ML IJ SOLN
INTRAMUSCULAR | Status: AC
  Filled 2022-08-12: qty 2

## 2022-08-12 MED ORDER — SODIUM CHLORIDE 0.9 % IR SOLN
Status: DC | PRN
  Administered 2022-08-12: 500 mL

## 2022-08-12 MED ORDER — ONDANSETRON HCL 4 MG/2ML IJ SOLN
INTRAMUSCULAR | Status: DC | PRN
  Administered 2022-08-12: 4 mg via INTRAVENOUS

## 2022-08-12 MED ORDER — ROCURONIUM BROMIDE 10 MG/ML (PF) SYRINGE
PREFILLED_SYRINGE | INTRAVENOUS | Status: AC
  Filled 2022-08-12: qty 10

## 2022-08-12 MED ORDER — OXYCODONE HCL 5 MG PO TABS: 5.0000 mg | ORAL_TABLET | Freq: Three times a day (TID) | ORAL | 0 refills | Status: AC | PRN

## 2022-08-12 MED ORDER — SUGAMMADEX SODIUM 200 MG/2ML IV SOLN
INTRAVENOUS | Status: DC | PRN
  Administered 2022-08-12: 200 mg via INTRAVENOUS

## 2022-08-12 MED ORDER — DEXAMETHASONE SODIUM PHOSPHATE 4 MG/ML IJ SOLN
INTRAMUSCULAR | Status: DC | PRN
  Administered 2022-08-12: 5 mg via INTRAVENOUS

## 2022-08-12 MED ORDER — BUPIVACAINE HCL (PF) 0.25 % IJ SOLN
INTRAMUSCULAR | Status: DC | PRN
  Administered 2022-08-12: 30 mL

## 2022-08-12 MED ORDER — DEXAMETHASONE SODIUM PHOSPHATE 10 MG/ML IJ SOLN
INTRAMUSCULAR | Status: AC
  Filled 2022-08-12: qty 1

## 2022-08-12 MED ORDER — EPHEDRINE SULFATE (PRESSORS) 50 MG/ML IJ SOLN
INTRAMUSCULAR | Status: DC | PRN
  Administered 2022-08-12 (×2): 10 mg via INTRAVENOUS

## 2022-08-12 MED ORDER — ONDANSETRON HCL 4 MG/2ML IJ SOLN
INTRAMUSCULAR | Status: AC
  Filled 2022-08-12: qty 2

## 2022-08-12 MED ORDER — CELECOXIB 200 MG PO CAPS
200.0000 mg | ORAL_CAPSULE | Freq: Once | ORAL | Status: AC
  Administered 2022-08-12: 200 mg via ORAL

## 2022-08-12 MED ORDER — CELECOXIB 200 MG PO CAPS
ORAL_CAPSULE | ORAL | Status: AC
  Filled 2022-08-12: qty 1

## 2022-08-12 MED ORDER — ONABOTULINUMTOXINA 100 UNITS IJ SOLR
INTRAMUSCULAR | Status: AC
  Filled 2022-08-12: qty 100

## 2022-08-12 MED ORDER — BUPIVACAINE LIPOSOME 1.3 % IJ SUSP: 20.0000 mL | Freq: Once | INTRAMUSCULAR | Status: DC

## 2022-08-12 MED ORDER — EPHEDRINE 5 MG/ML INJ
INTRAVENOUS | Status: AC
  Filled 2022-08-12: qty 5

## 2022-08-12 MED ORDER — MEPERIDINE HCL 25 MG/ML IJ SOLN: 6.2500 mg | INTRAMUSCULAR | Status: DC | PRN

## 2022-08-12 MED ORDER — CEFAZOLIN SODIUM-DEXTROSE 2-4 GM/100ML-% IV SOLN
INTRAVENOUS | Status: AC
  Filled 2022-08-12: qty 100

## 2022-08-12 MED ORDER — ACETAMINOPHEN 160 MG/5ML PO SOLN: 325.0000 mg | ORAL | Status: DC | PRN

## 2022-08-12 MED ORDER — OXYCODONE HCL 5 MG/5ML PO SOLN: 5.0000 mg | Freq: Once | ORAL | Status: DC | PRN

## 2022-08-12 MED ORDER — ONDANSETRON HCL 4 MG/2ML IJ SOLN: 4.0000 mg | Freq: Once | INTRAMUSCULAR | Status: DC | PRN

## 2022-08-12 MED ORDER — OXYCODONE HCL 5 MG PO TABS: 5.0000 mg | ORAL_TABLET | Freq: Once | ORAL | Status: DC | PRN

## 2022-08-12 MED ORDER — ROCURONIUM BROMIDE 100 MG/10ML IV SOLN
INTRAVENOUS | Status: DC | PRN
  Administered 2022-08-12: 70 mg via INTRAVENOUS

## 2022-08-12 MED ORDER — PROPOFOL 10 MG/ML IV BOLUS
INTRAVENOUS | Status: DC | PRN
  Administered 2022-08-12: 50 mg via INTRAVENOUS
  Administered 2022-08-12: 150 mg via INTRAVENOUS

## 2022-08-12 MED ORDER — BUPIVACAINE HCL (PF) 0.25 % IJ SOLN
INTRAMUSCULAR | Status: AC
  Filled 2022-08-12: qty 30

## 2022-08-12 MED ORDER — MIDAZOLAM HCL 5 MG/5ML IJ SOLN
INTRAMUSCULAR | Status: DC | PRN
  Administered 2022-08-12: 2 mg via INTRAVENOUS

## 2022-08-12 MED ORDER — ACETAMINOPHEN 500 MG PO TABS
ORAL_TABLET | ORAL | Status: AC
  Filled 2022-08-12: qty 2

## 2022-08-12 MED ORDER — CEFAZOLIN SODIUM-DEXTROSE 2-4 GM/100ML-% IV SOLN
2.0000 g | INTRAVENOUS | Status: AC
  Administered 2022-08-12: 2 g via INTRAVENOUS

## 2022-08-12 MED ORDER — BUPIVACAINE LIPOSOME 1.3 % IJ SUSP
INTRAMUSCULAR | Status: DC | PRN
  Administered 2022-08-12: 20 mL

## 2022-08-12 MED ORDER — BUPIVACAINE LIPOSOME 1.3 % IJ SUSP
INTRAMUSCULAR | Status: AC
  Filled 2022-08-12: qty 20

## 2022-08-12 MED ORDER — LIDOCAINE HCL (PF) 2 % IJ SOLN
INTRAMUSCULAR | Status: AC
  Filled 2022-08-12: qty 5

## 2022-08-12 MED ORDER — MIDAZOLAM HCL 2 MG/2ML IJ SOLN
INTRAMUSCULAR | Status: AC
  Filled 2022-08-12: qty 2

## 2022-08-12 MED ORDER — LIDOCAINE HCL (CARDIAC) PF 100 MG/5ML IV SOSY
PREFILLED_SYRINGE | INTRAVENOUS | Status: DC | PRN
  Administered 2022-08-12: 60 mg via INTRAVENOUS

## 2022-08-12 MED ORDER — LACTATED RINGERS IV SOLN: INTRAVENOUS | Status: DC

## 2022-08-12 MED ORDER — FENTANYL CITRATE (PF) 100 MCG/2ML IJ SOLN
INTRAMUSCULAR | Status: DC | PRN
  Administered 2022-08-12 (×2): 50 ug via INTRAVENOUS

## 2022-08-12 MED ORDER — ACETAMINOPHEN 500 MG PO TABS: 1000.0000 mg | ORAL_TABLET | Freq: Once | ORAL | Status: DC

## 2022-08-12 MED ORDER — ACETAMINOPHEN 500 MG PO TABS
1000.0000 mg | ORAL_TABLET | ORAL | Status: AC
  Administered 2022-08-12: 1000 mg via ORAL

## 2022-08-12 MED ORDER — FLEET ENEMA 7-19 GM/118ML RE ENEM: 1.0000 | ENEMA | Freq: Once | RECTAL | Status: DC

## 2022-08-12 SURGICAL SUPPLY — 72 items
APL SKNCLS STERI-STRIP NONHPOA (GAUZE/BANDAGES/DRESSINGS) ×1
BENZOIN TINCTURE PRP APPL 2/3 (GAUZE/BANDAGES/DRESSINGS) ×1 IMPLANT
BLADE EXTENDED COATED 6.5IN (ELECTRODE) ×1 IMPLANT
BLADE SURG 10 STRL SS (BLADE) IMPLANT
BLADE SURG 15 STRL LF DISP TIS (BLADE) ×1 IMPLANT
BLADE SURG 15 STRL SS (BLADE) ×1
BRIEF MESH DISP LRG (UNDERPADS AND DIAPERS) ×1 IMPLANT
COVER BACK TABLE 60X90IN (DRAPES) ×1 IMPLANT
COVER MAYO STAND STRL (DRAPES) ×1 IMPLANT
DRAPE HYSTEROSCOPY (MISCELLANEOUS) IMPLANT
DRAPE LAPAROTOMY 100X72 PEDS (DRAPES) ×1 IMPLANT
DRAPE SHEET LG 3/4 BI-LAMINATE (DRAPES) IMPLANT
DRAPE UTILITY XL STRL (DRAPES) ×1 IMPLANT
ELECT REM PT RETURN 9FT ADLT (ELECTROSURGICAL) ×1
ELECTRODE REM PT RTRN 9FT ADLT (ELECTROSURGICAL) ×1 IMPLANT
GAUZE 4X4 16PLY ~~LOC~~+RFID DBL (SPONGE) ×1 IMPLANT
GAUZE PAD ABD 8X10 STRL (GAUZE/BANDAGES/DRESSINGS) ×1 IMPLANT
GAUZE SPONGE 4X4 12PLY STRL (GAUZE/BANDAGES/DRESSINGS) ×1 IMPLANT
GLOVE BIO SURGEON STRL SZ7.5 (GLOVE) ×1 IMPLANT
GLOVE BIOGEL PI IND STRL 8 (GLOVE) ×1 IMPLANT
GOWN STRL REUS W/TWL LRG LVL3 (GOWN DISPOSABLE) ×1 IMPLANT
GOWN STRL REUS W/TWL XL LVL3 (GOWN DISPOSABLE) ×1 IMPLANT
HYDROGEN PEROXIDE 16OZ (MISCELLANEOUS) IMPLANT
IV CATH 14GX2 1/4 (CATHETERS) IMPLANT
IV CATH 18G SAFETY (IV SOLUTION) IMPLANT
KIT SIGMOIDOSCOPE (SET/KITS/TRAYS/PACK) IMPLANT
KIT TURNOVER CYSTO (KITS) ×1 IMPLANT
LEGGING LITHOTOMY PAIR STRL (DRAPES) IMPLANT
LIGASURE 7.4 SM JAW OPEN (ELECTROSURGICAL) IMPLANT
LOOP VASCLR MAXI BLUE 18IN ST (MISCELLANEOUS) IMPLANT
LOOP VASCULAR MAXI 18 BLUE (MISCELLANEOUS)
LOOPS VASCLR MAXI BLUE 18IN ST (MISCELLANEOUS) IMPLANT
NDL HYPO 25X1 1.5 SAFETY (NEEDLE) IMPLANT
NDL SAFETY ECLIP 18X1.5 (MISCELLANEOUS) IMPLANT
NEEDLE HYPO 22GX1.5 SAFETY (NEEDLE) ×1 IMPLANT
NEEDLE HYPO 25X1 1.5 SAFETY (NEEDLE) IMPLANT
NS IRRIG 500ML POUR BTL (IV SOLUTION) ×1 IMPLANT
PACK BASIN DAY SURGERY FS (CUSTOM PROCEDURE TRAY) ×1 IMPLANT
PAD ARMBOARD 7.5X6 YLW CONV (MISCELLANEOUS) IMPLANT
PENCIL SMOKE EVACUATOR (MISCELLANEOUS) ×1 IMPLANT
SPIKE FLUID TRANSFER (MISCELLANEOUS) ×1 IMPLANT
SPONGE HEMORRHOID 8X3CM (HEMOSTASIS) IMPLANT
SPONGE SURGIFOAM ABS GEL 100 (HEMOSTASIS) IMPLANT
SPONGE SURGIFOAM ABS GEL 12-7 (HEMOSTASIS) IMPLANT
SUT CHROMIC 2 0 SH (SUTURE) IMPLANT
SUT CHROMIC 3 0 SH 27 (SUTURE) IMPLANT
SUT CHROMIC 4 0 SH 27 (SUTURE) IMPLANT
SUT MNCRL AB 4-0 PS2 18 (SUTURE) IMPLANT
SUT SILK 0 TIES 10X30 (SUTURE) ×1 IMPLANT
SUT SILK 2 0 (SUTURE)
SUT SILK 2 0 SH (SUTURE) ×1 IMPLANT
SUT SILK 2-0 18XBRD TIE 12 (SUTURE) IMPLANT
SUT VIC AB 2-0 SH 27 (SUTURE) ×2
SUT VIC AB 2-0 SH 27XBRD (SUTURE) IMPLANT
SUT VIC AB 2-0 UR6 27 (SUTURE) ×1 IMPLANT
SUT VIC AB 3-0 SH 18 (SUTURE) IMPLANT
SUT VIC AB 3-0 SH 27 (SUTURE) ×2
SUT VIC AB 3-0 SH 27X BRD (SUTURE) ×1 IMPLANT
SUT VIC AB 3-0 SH 27XBRD (SUTURE) IMPLANT
SUT VIC AB 4-0 P-3 18XBRD (SUTURE) IMPLANT
SUT VIC AB 4-0 P3 18 (SUTURE)
SYR 20ML LL LF (SYRINGE) IMPLANT
SYR BULB EAR ULCER 3OZ GRN STR (SYRINGE) IMPLANT
SYR BULB IRRIG 60ML STRL (SYRINGE) ×1 IMPLANT
SYR CONTROL 10ML LL (SYRINGE) ×1 IMPLANT
SYR TB 1ML LL NO SAFETY (SYRINGE) IMPLANT
TOWEL OR 17X26 10 PK STRL BLUE (TOWEL DISPOSABLE) ×1 IMPLANT
TRAY DSU PREP LF (CUSTOM PROCEDURE TRAY) ×1 IMPLANT
TUBE CONNECTING 12X1/4 (SUCTIONS) ×1 IMPLANT
VASCULAR TIE MAXI BLUE 18IN ST (MISCELLANEOUS)
WATER STERILE IRR 500ML POUR (IV SOLUTION) IMPLANT
YANKAUER SUCT BULB TIP NO VENT (SUCTIONS) ×1 IMPLANT

## 2022-08-12 NOTE — Op Note (Signed)
08/12/2022  8:46 AM  PATIENT:  Debbie Wade  56 y.o. female  Patient Care Team: Merri Brunette, MD as PCP - General (Internal Medicine)  PRE-OPERATIVE DIAGNOSIS:  Hemorrhoids  POST-OPERATIVE DIAGNOSIS:  Same  PROCEDURE:   Hemorrhoidectomy left anterior Hemorrhoidectomy right posterior Anorectal exam under anesthesia  SURGEON:  Surgeon(s): Andria Meuse, MD  ASSISTANT: OR staff   ANESTHESIA:   local and general  SPECIMEN:   Hemorrhoid left anterior Hemorrhoid right posterior  DISPOSITION OF SPECIMEN:  PATHOLOGY  COUNTS:  Sponge, needle, and instrument counts were reported correct x2 at conclusion.  EBL: 10 mL  Drains: None  PLAN OF CARE: Discharge to home after PACU  PATIENT DISPOSITION:  PACU - hemodynamically stable.  OR FINDINGS: Two column internal  - left anterior and right posterior. Small external hemorrhoid left anterior. All hemorrhoidal tissue removed.  DESCRIPTION: The patient was identified in the preoperative holding area and taken to the OR. SCDs were applied. She then underwent general endotracheal anesthesia without difficulty. The patient was then rolled onto the OR table in the prone jackknife position. Pressure points were then evaluated and padded. Benzoin was applied to the buttocks and they were gently taped apart.  She was then prepped and draped in usual sterile fashion.  A surgical timeout was performed indicating the correct patient, procedure, and positioning.  A perianal block was then created using a dilute mixture of 0.25% Marcaine and Exparel.  After ascertaining an appropriate level of anesthesia had been achieved, a well lubricated digital rectal exam was performed. This demonstrated she had no palpable masses or other abnormalities.  A Hill-Ferguson anoscope was into the anal canal and circumferential inspection demonstrated healthy appearing anoderm without fissures or ulcerations.  A Pratt bivalve rectal speculum was used to  inspect the hemorrhoids. She has 2 columns of internal hemorrhoids in the left anterior and right posterior positions. There is a small external hemorrhoid in the left anterior position. There is no other significant hemorrhoidal tissue.  Attention is directed at the left anterior hemorrhoidal tissue first.  Hill-Ferguson retractor was placed.  The hemorrhoidal tissue was elevated with a DeBakey forcep.  This is then scored using electrocautery.  The hemorrhoidal tissue was then incised sharply and dissected free of the underlying sphincter muscle.  The pedicle of the hemorrhoid was then excised using electrocautery.  This is passed off the specimen.  A figure-of-eight 2-0 Vicryl was used to oversew the pedicle of the hemorrhoid.  The hemorrhoidal defect was then closed using a running 2-0 Vicryl suture, transitioning to a 3-0 Vicryl suture as we worked more externally.  The anal canal was irrigated and hemostasis is verified.  There is a small external hemorrhoid in the left anterior position as well.  This is excised sharply and passed off as additional specimen with the left anterior internal hemorrhoidal tissue.  This is then approximated using a 4-0 chromic stitch.  Attention is then directed at the right posterior hemorrhoidal tissue.  With a Hill-Ferguson retractor in place, the hemorrhoidal tissue was elevated with a DeBakey forcep.  This is then scored using electrocautery.  The hemorrhoidal tissue was then incised sharply and dissected free of the underlying sphincter muscle.  The pedicle of the hemorrhoid was then excised using electrocautery.  This is passed off the specimen.  A figure-of-eight 2-0 Vicryl was used to oversew the pedicle of the hemorrhoid.  The hemorrhoidal defect was then closed using a running 2-0 Vicryl suture, transitioning to a 3-0 Vicryl suture  as we worked more externally. The anal canal was irrigated and hemostasis is verified.   The buttocks are untaped.  A dressing  consisting of 4 x 4's, ABD, and mesh underwear was placed.  She was rolled back onto the stretcher, awakened from anesthesia, extubated, and transferred to the recovery room in satisfactory condition having tolerated the procedure well.  DISPOSITION: PACU in satisfactory condition.

## 2022-08-12 NOTE — Discharge Instructions (Addendum)
ANORECTAL SURGERY: POST OP INSTRUCTIONS  DIET: Follow a light bland diet the first 24 hours after arrival home, such as soup, liquids, crackers, etc.  Be sure to include lots of fluids daily.  Avoid fast food or heavy meals as your are more likely to get nauseated.  Eat a low fat diet the next few days after surgery.   Some bleeding with bowel movements is expected for the first couple of days but this should stop in between bowel movements  Take your usually prescribed home medications unless otherwise directed. No foreign bodies per rectum for the next 3 months (enemas, etc)  PAIN CONTROL: It is helpful to take an over-the-counter pain medication regularly for the first few days/weeks.  Choose from the following that works best for you: Ibuprofen (Advil, etc) Three 200mg  tabs every 6 hours as needed.  Next dose due at 12:15 pm, if needed. Acetaminophen (Tylenol, etc) 500-650mg  every 6 hours as needed.  Next dose due at 12:15 pm, if needed. NOTE: You may take both of these medications together - most patients find it most helpful when alternating between the two (i.e. Ibuprofen at 6am, tylenol at 9am, ibuprofen at 12pm .. ) A  prescription for pain medication may have been prescribed for you at discharge.  Take your pain medication as prescribed.  If you are having problems/concerns with the prescription medicine, please call [facility] for further advice.  Avoid getting constipated.  Between the surgery and the pain medications, it is common to experience some constipation.  Increasing fluid intake (64oz of water per day) and taking a fiber supplement (such as Metamucil, Citrucel, FiberCon) 1-2 times a day regularly will usually help prevent this problem from occurring.  Take Miralax (over the counter) 1-2x/day while taking a narcotic pain medication. If no bowel movement after 48hours, you may additionally take a laxative like a bottle of Milk of Magnesia which can be purchased over the counter. Avoid  enemas.   Watch out for diarrhea.  If you have many loose bowel movements, simplify your diet to bland foods.  Stop any stool softeners and decrease your fiber supplement. If this worsens or does not improve, please call us.  Wash / shower every day.  If you were discharged with a dressing, you may remove this the day after your surgery. You may shower normally, getting soap/water on your wound, particularly after bowel movements.  Soaking in a warm bath filled a couple inches ("Sitz bath") is a great way to clean the area after a bowel movement and many patients find it is a way to soothe the area.  ACTIVITIES as tolerated:   You may resume regular (light) daily activities beginning the next day--such as daily self-care, walking, climbing stairs--gradually increasing activities as tolerated.  If you can walk 30 minutes without difficulty, it is safe to try more intense activity such as jogging, treadmill, bicycling, low-impact aerobics, etc. Refrain from any heavy lifting or straining for the first 2 weeks after your procedure, particularly if your surgery was for hemorrhoids. Avoid activities that make your pain worse You may drive when you are no longer taking prescription pain medication, you can comfortably wear a seatbelt, and you can safely maneuver your car and apply brakes.  FOLLOW UP in our office Please call [facility] at [phone] to set up an appointment to see your surgeon in the office for a follow-up appointment approximately 2 weeks after your surgery. Make sure that you call for this appointment the day you  arrive home to insure a convenient appointment time.  9. If you have disability or family leave forms that need to be completed, you may have them completed by your primary care physician's office; for return to work instructions, please ask our office staff and they will be happy to assist you in obtaining this documentation   When to call us [phone]: Poor pain  control Reactions / problems with new medications (rash/itching, etc)  Fever over 101.5 F (38.5 C) Inability to urinate Nausea/vomiting Worsening swelling or bruising Continued bleeding from incision. Increased pain, redness, or drainage from the incision  The clinic staff is available to answer your questions during regular business hours (8:30am-5pm).  Please don't hesitate to call and ask to speak to one of our nurses for clinical concerns.   A surgeon from [facility] is always on call at the hospitals   If you have a medical emergency, go to the nearest emergency room or call 911.   [facility] [address], [place], [place]  [address] MAIN: [phone] FAX: [phone] [email or web address]     Post Anesthesia Home Care Instructions  Activity: Get plenty of rest for the remainder of the day. A responsible individual must stay with you for 24 hours following the procedure.  For the next 24 hours, DO NOT: -Drive a car -Advertising copywriter -Drink alcoholic beverages -Take any medication unless instructed by your physician -Make any legal decisions or sign important papers.  Meals: Start with liquid foods such as gelatin or soup. Progress to regular foods as tolerated. Avoid greasy, spicy, heavy foods. If nausea and/or vomiting occur, drink only clear liquids until the nausea and/or vomiting subsides. Call your physician if vomiting continues.  Special Instructions/Symptoms: Your throat may feel dry or sore from the anesthesia or the breathing tube placed in your throat during surgery. If this causes discomfort, gargle with warm salt water. The discomfort should disappear within 24 hours.     Information for Discharge Teaching: EXPAREL (bupivacaine liposome injectable suspension)   Your surgeon or anesthesiologist gave you EXPAREL(bupivacaine) to help control your pain after surgery.  EXPAREL  is a local anesthetic that provides pain relief by numbing the tissue around the surgical site. EXPAREL is designed to release pain medication over time and can control pain for up to 72 hours. Depending on how you respond to EXPAREL, you may require less pain medication during your recovery.  Possible side effects: Temporary loss of sensation or ability to move in the area where bupivacaine was injected. Nausea, vomiting, constipation Rarely, numbness and tingling in your mouth or lips, lightheadedness, or anxiety may occur. Call your doctor right away if you think you may be experiencing any of these sensations, or if you have other questions regarding possible side effects.  Follow all other discharge instructions given to you by your surgeon or nurse. Eat a healthy diet and drink plenty of water or other fluids.  If you return to the hospital for any reason within 96 hours following the administration of EXPAREL, it is important for health care providers to know that you have received this anesthetic. A teal colored band has been placed on your arm with the date, time and amount of EXPAREL you have received in order to alert and inform your health care providers. Please leave this armband in place for the full 96 hours following administration, and then you may remove the band.

## 2022-08-12 NOTE — Anesthesia Procedure Notes (Signed)
Procedure Name: Intubation Date/Time: 08/12/2022 7:48 AM  Performed by: Justice Rocher, CRNAPre-anesthesia Checklist: Patient identified, Emergency Drugs available, Suction available, Patient being monitored and Timeout performed Patient Re-evaluated:Patient Re-evaluated prior to induction Oxygen Delivery Method: Circle system utilized Preoxygenation: Pre-oxygenation with 100% oxygen Induction Type: IV induction Ventilation: Mask ventilation without difficulty Laryngoscope Size: Mac and 3 Grade View: Grade II Tube type: Oral Tube size: 7.0 mm Number of attempts: 1 Airway Equipment and Method: Stylet and Oral airway Placement Confirmation: ETT inserted through vocal cords under direct vision, positive ETCO2, breath sounds checked- equal and bilateral and CO2 detector Secured at: 22 cm Tube secured with: Tape Dental Injury: Teeth and Oropharynx as per pre-operative assessment

## 2022-08-12 NOTE — Transfer of Care (Signed)
Immediate Anesthesia Transfer of Care Note  Patient: Debbie Wade  Procedure(s) Performed: Procedure(s) (LRB): HEMORRHOIDECTOMY (N/A) ANORECTAL EXAM UNDER ANESTHESIA (N/A)  Patient Location: PACU  Anesthesia Type: General  Level of Consciousness: awake, oriented, sedated and patient cooperative  Airway & Oxygen Therapy: Patient Spontanous Breathing and Patient connected to face mask oxygen  Post-op Assessment: Report given to PACU RN and Post -op Vital signs reviewed and stable  Post vital signs: Reviewed and stable  Complications: No apparent anesthesia complications Last Vitals:  Vitals Value Taken Time  BP 165/94 08/12/22 0851  Temp    Pulse 72 08/12/22 0853  Resp 14 08/12/22 0853  SpO2 99 % 08/12/22 0853  Vitals shown include unvalidated device data.  Last Pain:  Vitals:   08/12/22 0607  TempSrc: Oral  PainSc: 0-No pain      Patients Stated Pain Goal: 5 (08/12/22 1950)  Complications: No notable events documented.

## 2022-08-12 NOTE — H&P (Signed)
CC: here today for surgery  HPI: Debbie Wade is an 56 y.o. female whom was seen in the office 04/15/22 as a referral by Dr. Shelia Media for evaluation of hemorrhoids..   Last colonoscopy 06/15/19 with Dr. Ardis Hughs -  Internal and external hemorrhoids. Exam was otherwise normal.  She has had banding sessions with Dr. Silverio Decamp x3-07/2019, 08/2019, 09/2019.  She reports a 28-year history of prolapsing anal tissue with bowel movements. This all began around the time of her birth of her child. That time she did have 1/3 degree tear. She has had issues with some degree of discomfort with bowel movements, prolapsing tissue that she generally has to reduce, occasional blood. She has been taking Benefiber on a regular regimen, daily. With this, generally has 1-2 soft bowel movements per day. She drinks at least 64 ounces of water per day. Spends approximately 5 minutes on the commode having bowel movements.  She does have some degree of urinary incontinence particularly urgency. We had offered a referral to see pelvic floor physical therapy/urology but she would like time to think about this. She denies any issues with incontinence to gas, liquid, or solid stool. She reports good control.  Denies any changes in her health or health history since we met aside from a minor URI that she recovered from. Had negative flu/covid test.  PMH: denies  PSH: Hemorrhoidal banding x3, otherwise denies any anorectal surgeries or procedures.  FHx: Aunt had breast cancer, otherwise denies any known family history of colorectal, breast, endometrial or ovarian cancer  Social Hx: Denies use of tobacco/EtOH/illicit drug. She works as a Copy for the school system.  Review of Systems: A complete review of systems was obtained from the patient. I have reviewed this information and discussed as appropriate with the patient. See HPI as well for other ROS.   Past Medical History:  Diagnosis Date    Allergic rhinitis    Allergy    Anemia    Anxiety    Asthma    Depression    External hemorrhoids    Family history of adverse reaction to anesthesia    Mother slow to wake   Fibromyalgia    Internal hemorrhoids    Migraines    Osteoarthritis     Past Surgical History:  Procedure Laterality Date   COLONOSCOPY     LAPAROSCOPIC ASSISTED VAGINAL HYSTERECTOMY  2013,OCTOBER   none     TONSILLECTOMY     age 89    Family History  Problem Relation Age of Onset   Diabetes Maternal Grandmother    Heart failure Maternal Grandmother    Hypertension Mother    Diabetes Maternal Grandfather    Heart disease Maternal Grandfather    Breast cancer Paternal Aunt    Colon cancer Neg Hx    Colon polyps Neg Hx    Esophageal cancer Neg Hx    Rectal cancer Neg Hx    Stomach cancer Neg Hx     Social:  reports that she has never smoked. She has never used smokeless tobacco. She reports current alcohol use. She reports that she does not use drugs.  Allergies:  Allergies  Allergen Reactions   Erythromycin Rash    Medications: I have reviewed the patient's current medications.  No results found for this or any previous visit (from the past 48 hour(s)).  No results found.  ROS - all of the below systems have been reviewed with the patient and positives are indicated with  bold text General: chills, fever or night sweats Eyes: blurry vision or double vision ENT: epistaxis or sore throat Allergy/Immunology: itchy/watery eyes or nasal congestion Hematologic/Lymphatic: bleeding problems, blood clots or swollen lymph nodes Endocrine: temperature intolerance or unexpected weight changes Breast: new or changing breast lumps or nipple discharge Resp: cough, shortness of breath, or wheezing CV: chest pain or dyspnea on exertion GI: as per HPI GU: dysuria, trouble voiding, or hematuria MSK: joint pain or joint stiffness Neuro: TIA or stroke symptoms Derm: pruritus and skin lesion  changes Psych: anxiety and depression  PE Blood pressure 112/84, pulse 78, temperature 97.8 F (36.6 C), temperature source Oral, resp. rate 15, height _0  (1.6 m), weight 57.7 kg, last menstrual period 01/28/2012, SpO2 99 %. Constitutional: NAD; conversant; no deformities Eyes: Moist conjunctiva Lungs: Normal respiratory effort CV: RRR MSK: Normal range of motion of extremities Psychiatric: Appropriate affect; alert and oriented x3  No results found for this or any previous visit (from the past 48 hour(s)).  No results found.  A/P: Debbie Wade is an 57 y.o. female with grade 3 internal hemorrhoids-now status post banding x3.  -We discussed the anatomy and physiology of the GI tract and pathophysiology of hemorrhoids with associated illustrations. She has already been taking a daily fiber supplement (Metamucil/Citrucel/FiberCon/Benefiber/Konsyl), drinking 64 ounces of water per day (8, 8 ounce glasses) and minimizing commode times... despite this and multiple banding sessions has had ongoing issues with her prolapsing internal hemorrhoids. We therefore reviewed further observation vs surgery - hemorrhoidectomy, anorectal exam under anesthesia  -The planned procedures, material risks (including, but not limited to, pain, bleeding, infection, scarring, need for blood transfusion, damage to anal sphincter, incontinence of gas and/or stool, need for additional procedures, anal stenosis, rare cases of pelvic sepsis which in severe cases may require things like a colostomy, recurrence, pneumonia, heart attack, stroke, death) benefits and alternatives to surgery were discussed at length. We have noted a good probability that the procedure would help improve her symptoms. The patient's questions were answered to her satisfaction, she voiced understanding and has elected to proceed with surgery. Additionally, we discussed typical postoperative expectations and the recovery  process.  Nadeen Landau, [facility], [place]

## 2022-08-13 ENCOUNTER — Encounter (HOSPITAL_BASED_OUTPATIENT_CLINIC_OR_DEPARTMENT_OTHER): Payer: Self-pay | Admitting: Surgery

## 2022-08-13 LAB — SURGICAL PATHOLOGY

## 2022-08-13 NOTE — Anesthesia Postprocedure Evaluation (Signed)
Anesthesia Post Note  Patient: Debbie Wade  Procedure(s) Performed: HEMORRHOIDECTOMY ANORECTAL EXAM UNDER ANESTHESIA     Patient location during evaluation: PACU Anesthesia Type: General Level of consciousness: awake and alert Pain management: pain level controlled Vital Signs Assessment: post-procedure vital signs reviewed and stable Respiratory status: spontaneous breathing, nonlabored ventilation, respiratory function stable and patient connected to nasal cannula oxygen Cardiovascular status: blood pressure returned to baseline and stable Postop Assessment: no apparent nausea or vomiting Anesthetic complications: no  No notable events documented.  Last Vitals:  Vitals:   08/12/22 0930 08/12/22 1011  BP: (!) 148/85 135/81  Pulse: 75 78  Resp: (!) 4 14  Temp: 36.4 C 36.5 C  SpO2: 95% 100%    Last Pain:  Vitals:   08/13/22 1150  TempSrc:   PainSc: 2                  [name]

## 2022-09-13 DIAGNOSIS — F411 Generalized anxiety disorder: Secondary | ICD-10-CM | POA: Diagnosis not present

## 2022-09-13 DIAGNOSIS — F331 Major depressive disorder, recurrent, moderate: Secondary | ICD-10-CM | POA: Diagnosis not present

## 2022-09-27 DIAGNOSIS — M545 Low back pain, unspecified: Secondary | ICD-10-CM | POA: Diagnosis not present

## 2022-09-27 DIAGNOSIS — R222 Localized swelling, mass and lump, trunk: Secondary | ICD-10-CM | POA: Diagnosis not present

## 2022-11-16 DIAGNOSIS — G43719 Chronic migraine without aura, intractable, without status migrainosus: Secondary | ICD-10-CM | POA: Diagnosis not present

## 2022-12-02 DIAGNOSIS — G43719 Chronic migraine without aura, intractable, without status migrainosus: Secondary | ICD-10-CM | POA: Diagnosis not present

## 2022-12-07 DIAGNOSIS — F411 Generalized anxiety disorder: Secondary | ICD-10-CM | POA: Diagnosis not present

## 2022-12-07 DIAGNOSIS — F331 Major depressive disorder, recurrent, moderate: Secondary | ICD-10-CM | POA: Diagnosis not present

## 2022-12-15 DIAGNOSIS — G43719 Chronic migraine without aura, intractable, without status migrainosus: Secondary | ICD-10-CM | POA: Diagnosis not present

## 2022-12-23 DIAGNOSIS — G43E19 Chronic migraine with aura, intractable, without status migrainosus: Secondary | ICD-10-CM | POA: Diagnosis not present

## 2023-01-19 ENCOUNTER — Other Ambulatory Visit: Payer: Self-pay

## 2023-01-19 DIAGNOSIS — Q7649 Other congenital malformations of spine, not associated with scoliosis: Secondary | ICD-10-CM

## 2023-01-19 DIAGNOSIS — M549 Dorsalgia, unspecified: Secondary | ICD-10-CM

## 2023-01-20 DIAGNOSIS — M5451 Vertebrogenic low back pain: Secondary | ICD-10-CM | POA: Diagnosis not present

## 2023-01-21 ENCOUNTER — Other Ambulatory Visit: Payer: Self-pay | Admitting: Medical

## 2023-01-21 DIAGNOSIS — M5451 Vertebrogenic low back pain: Secondary | ICD-10-CM

## 2023-01-28 ENCOUNTER — Other Ambulatory Visit: Payer: BC Managed Care – PPO

## 2023-01-28 ENCOUNTER — Ambulatory Visit
Admission: RE | Admit: 2023-01-28 | Discharge: 2023-01-28 | Disposition: A | Discharge status: Home / Self Care | Payer: BC Managed Care – PPO | Source: Ambulatory Visit | Attending: Medical | Admitting: Medical

## 2023-01-28 DIAGNOSIS — M5451 Vertebrogenic low back pain: Secondary | ICD-10-CM

## 2023-01-28 DIAGNOSIS — R222 Localized swelling, mass and lump, trunk: Secondary | ICD-10-CM | POA: Diagnosis not present

## 2023-03-24 DIAGNOSIS — Z0000 Encounter for general adult medical examination without abnormal findings: Secondary | ICD-10-CM | POA: Diagnosis not present

## 2023-03-29 DIAGNOSIS — G43909 Migraine, unspecified, not intractable, without status migrainosus: Secondary | ICD-10-CM | POA: Diagnosis not present

## 2023-03-29 DIAGNOSIS — Z0000 Encounter for general adult medical examination without abnormal findings: Secondary | ICD-10-CM | POA: Diagnosis not present

## 2023-03-29 DIAGNOSIS — J45990 Exercise induced bronchospasm: Secondary | ICD-10-CM | POA: Diagnosis not present

## 2023-03-29 DIAGNOSIS — R053 Chronic cough: Secondary | ICD-10-CM | POA: Diagnosis not present

## 2023-03-29 DIAGNOSIS — B0229 Other postherpetic nervous system involvement: Secondary | ICD-10-CM | POA: Diagnosis not present

## 2023-05-02 DIAGNOSIS — G43E19 Chronic migraine with aura, intractable, without status migrainosus: Secondary | ICD-10-CM | POA: Diagnosis not present

## 2023-05-02 DIAGNOSIS — Q283 Other malformations of cerebral vessels: Secondary | ICD-10-CM | POA: Diagnosis not present

## 2023-05-02 DIAGNOSIS — G9389 Other specified disorders of brain: Secondary | ICD-10-CM | POA: Diagnosis not present

## 2023-05-10 DIAGNOSIS — F331 Major depressive disorder, recurrent, moderate: Secondary | ICD-10-CM | POA: Diagnosis not present

## 2023-05-10 DIAGNOSIS — F411 Generalized anxiety disorder: Secondary | ICD-10-CM | POA: Diagnosis not present

## 2023-08-02 DIAGNOSIS — F411 Generalized anxiety disorder: Secondary | ICD-10-CM | POA: Diagnosis not present

## 2023-08-02 DIAGNOSIS — Z1272 Encounter for screening for malignant neoplasm of vagina: Secondary | ICD-10-CM | POA: Diagnosis not present

## 2023-08-02 DIAGNOSIS — Z1231 Encounter for screening mammogram for malignant neoplasm of breast: Secondary | ICD-10-CM | POA: Diagnosis not present

## 2023-08-02 DIAGNOSIS — Z01419 Encounter for gynecological examination (general) (routine) without abnormal findings: Secondary | ICD-10-CM | POA: Diagnosis not present

## 2023-08-02 DIAGNOSIS — F331 Major depressive disorder, recurrent, moderate: Secondary | ICD-10-CM | POA: Diagnosis not present

## 2023-08-02 DIAGNOSIS — Z6823 Body mass index (BMI) 23.0-23.9, adult: Secondary | ICD-10-CM | POA: Diagnosis not present

## 2023-09-16 DIAGNOSIS — G43E19 Chronic migraine with aura, intractable, without status migrainosus: Secondary | ICD-10-CM | POA: Diagnosis not present

## 2023-09-19 DIAGNOSIS — L710 Perioral dermatitis: Secondary | ICD-10-CM | POA: Diagnosis not present

## 2023-09-19 DIAGNOSIS — L820 Inflamed seborrheic keratosis: Secondary | ICD-10-CM | POA: Diagnosis not present

## 2023-10-25 DIAGNOSIS — F411 Generalized anxiety disorder: Secondary | ICD-10-CM | POA: Diagnosis not present

## 2023-10-25 DIAGNOSIS — F331 Major depressive disorder, recurrent, moderate: Secondary | ICD-10-CM | POA: Diagnosis not present

## 2023-12-26 DIAGNOSIS — L710 Perioral dermatitis: Secondary | ICD-10-CM | POA: Diagnosis not present

## 2023-12-28 DIAGNOSIS — G43E19 Chronic migraine with aura, intractable, without status migrainosus: Secondary | ICD-10-CM | POA: Diagnosis not present

## 2024-01-17 DIAGNOSIS — F331 Major depressive disorder, recurrent, moderate: Secondary | ICD-10-CM | POA: Diagnosis not present

## 2024-01-17 DIAGNOSIS — F411 Generalized anxiety disorder: Secondary | ICD-10-CM | POA: Diagnosis not present

## 2024-04-12 DIAGNOSIS — Z0000 Encounter for general adult medical examination without abnormal findings: Secondary | ICD-10-CM | POA: Diagnosis not present

## 2024-04-17 DIAGNOSIS — E7800 Pure hypercholesterolemia, unspecified: Secondary | ICD-10-CM | POA: Diagnosis not present

## 2024-04-17 DIAGNOSIS — Z23 Encounter for immunization: Secondary | ICD-10-CM | POA: Diagnosis not present

## 2024-04-17 DIAGNOSIS — G43909 Migraine, unspecified, not intractable, without status migrainosus: Secondary | ICD-10-CM | POA: Diagnosis not present

## 2024-04-17 DIAGNOSIS — J45990 Exercise induced bronchospasm: Secondary | ICD-10-CM | POA: Diagnosis not present

## 2024-04-17 DIAGNOSIS — Z0000 Encounter for general adult medical examination without abnormal findings: Secondary | ICD-10-CM | POA: Diagnosis not present

## 2024-04-17 DIAGNOSIS — F411 Generalized anxiety disorder: Secondary | ICD-10-CM | POA: Diagnosis not present

## 2024-04-18 DIAGNOSIS — F3340 Major depressive disorder, recurrent, in remission, unspecified: Secondary | ICD-10-CM | POA: Diagnosis not present

## 2024-04-18 DIAGNOSIS — F411 Generalized anxiety disorder: Secondary | ICD-10-CM | POA: Diagnosis not present

## 2024-06-21 DIAGNOSIS — M25521 Pain in right elbow: Secondary | ICD-10-CM | POA: Diagnosis not present

## 2024-07-06 DIAGNOSIS — M25521 Pain in right elbow: Secondary | ICD-10-CM | POA: Diagnosis not present

## 2024-07-06 DIAGNOSIS — M7711 Lateral epicondylitis, right elbow: Secondary | ICD-10-CM | POA: Diagnosis not present

## 2024-07-10 DIAGNOSIS — F411 Generalized anxiety disorder: Secondary | ICD-10-CM | POA: Diagnosis not present

## 2024-07-10 DIAGNOSIS — F3340 Major depressive disorder, recurrent, in remission, unspecified: Secondary | ICD-10-CM | POA: Diagnosis not present

## 2024-07-19 DIAGNOSIS — M25521 Pain in right elbow: Secondary | ICD-10-CM | POA: Diagnosis not present

## 2024-07-19 DIAGNOSIS — M7711 Lateral epicondylitis, right elbow: Secondary | ICD-10-CM | POA: Diagnosis not present

## 2024-07-26 DIAGNOSIS — M7711 Lateral epicondylitis, right elbow: Secondary | ICD-10-CM | POA: Diagnosis not present

## 2024-07-26 DIAGNOSIS — M25521 Pain in right elbow: Secondary | ICD-10-CM | POA: Diagnosis not present

## 2024-08-01 DIAGNOSIS — M25521 Pain in right elbow: Secondary | ICD-10-CM | POA: Diagnosis not present

## 2024-08-01 DIAGNOSIS — M7711 Lateral epicondylitis, right elbow: Secondary | ICD-10-CM | POA: Diagnosis not present
